# Patient Record
Sex: Female | Born: 1960 | Race: White | Hispanic: No | Smoking: Never smoker
Health system: Southern US, Community
[De-identification: ages and names within clinical notes are randomized; demographics above are authoritative.]

## PROBLEM LIST (undated history)

## (undated) DIAGNOSIS — N393 Stress incontinence (female) (male): Secondary | ICD-10-CM

## (undated) DIAGNOSIS — K5900 Constipation, unspecified: Secondary | ICD-10-CM

## (undated) DIAGNOSIS — IMO0002 Reserved for concepts with insufficient information to code with codable children: Secondary | ICD-10-CM

## (undated) DIAGNOSIS — N920 Excessive and frequent menstruation with regular cycle: Secondary | ICD-10-CM

## (undated) DIAGNOSIS — K219 Gastro-esophageal reflux disease without esophagitis: Secondary | ICD-10-CM

## (undated) DIAGNOSIS — M1990 Unspecified osteoarthritis, unspecified site: Secondary | ICD-10-CM

## (undated) DIAGNOSIS — J302 Other seasonal allergic rhinitis: Secondary | ICD-10-CM

## (undated) DIAGNOSIS — F419 Anxiety disorder, unspecified: Secondary | ICD-10-CM

## (undated) DIAGNOSIS — R638 Other symptoms and signs concerning food and fluid intake: Secondary | ICD-10-CM

## (undated) DIAGNOSIS — I10 Essential (primary) hypertension: Secondary | ICD-10-CM

## (undated) DIAGNOSIS — J45909 Unspecified asthma, uncomplicated: Secondary | ICD-10-CM

## (undated) DIAGNOSIS — Z780 Asymptomatic menopausal state: Secondary | ICD-10-CM

## (undated) HISTORY — DX: Unspecified osteoarthritis, unspecified site: M19.90

## (undated) HISTORY — DX: Asymptomatic menopausal state: Z78.0

## (undated) HISTORY — PX: ABLATION: SHX5711

## (undated) HISTORY — DX: Constipation, unspecified: K59.00

## (undated) HISTORY — DX: Other seasonal allergic rhinitis: J30.2

## (undated) HISTORY — DX: Excessive and frequent menstruation with regular cycle: N92.0

## (undated) HISTORY — PX: DILATION AND CURETTAGE OF UTERUS: SHX78

## (undated) HISTORY — PX: CARPAL TUNNEL RELEASE: SHX101

## (undated) HISTORY — DX: Reserved for concepts with insufficient information to code with codable children: IMO0002

## (undated) HISTORY — DX: Unspecified asthma, uncomplicated: J45.909

## (undated) HISTORY — DX: Anxiety disorder, unspecified: F41.9

## (undated) HISTORY — DX: Other symptoms and signs concerning food and fluid intake: R63.8

## (undated) HISTORY — DX: Essential (primary) hypertension: I10

## (undated) HISTORY — PX: HYSTEROSCOPY: SHX211

## (undated) HISTORY — DX: Stress incontinence (female) (male): N39.3

## (undated) HISTORY — PX: LAPAROSCOPIC GASTRIC SLEEVE RESECTION: SHX5895

## (undated) HISTORY — DX: Gastro-esophageal reflux disease without esophagitis: K21.9

---

## 2006-02-01 ENCOUNTER — Other Ambulatory Visit: Payer: Self-pay

## 2006-02-07 ENCOUNTER — Ambulatory Visit: Payer: Self-pay | Admitting: Podiatry

## 2006-11-22 ENCOUNTER — Emergency Department: Payer: Self-pay | Admitting: Unknown Physician Specialty

## 2010-04-07 ENCOUNTER — Encounter: Payer: Self-pay | Admitting: Unknown Physician Specialty

## 2010-04-20 ENCOUNTER — Encounter: Payer: Self-pay | Admitting: Unknown Physician Specialty

## 2010-05-19 ENCOUNTER — Encounter: Payer: Self-pay | Admitting: Unknown Physician Specialty

## 2011-02-28 ENCOUNTER — Ambulatory Visit: Payer: Self-pay | Admitting: Obstetrics and Gynecology

## 2011-03-06 ENCOUNTER — Ambulatory Visit: Payer: Self-pay | Admitting: Obstetrics and Gynecology

## 2011-06-15 ENCOUNTER — Ambulatory Visit: Payer: Self-pay | Admitting: Internal Medicine

## 2011-09-06 ENCOUNTER — Ambulatory Visit: Payer: Self-pay | Admitting: Anesthesiology

## 2011-09-06 LAB — ELECTROLYTE PANEL
Anion Gap: 6 — ABNORMAL LOW (ref 7–16)
Co2: 33 mmol/L — ABNORMAL HIGH (ref 21–32)
Potassium: 3.6 mmol/L (ref 3.5–5.1)
Sodium: 137 mmol/L (ref 136–145)

## 2011-09-08 ENCOUNTER — Ambulatory Visit: Payer: Self-pay | Admitting: Unknown Physician Specialty

## 2013-09-05 ENCOUNTER — Ambulatory Visit: Payer: Self-pay | Admitting: Obstetrics and Gynecology

## 2014-07-12 NOTE — Op Note (Signed)
PATIENT NAME:  Caitlyn [name], Caitlyn [name] MR#:  [id number] DATE OF BIRTH:  [birth year]  DATE OF PROCEDURE:  03/06/2011  PREOPERATIVE DIAGNOSIS: Menorrhagia.   POSTOPERATIVE DIAGNOSIS: Menorrhagia.  PROCEDURES PERFORMED: Dilation and curettage with hysteroscopy and NovaSure endometrial ablation.   SURGEON: Prentice DockerMartin [name]. Alfretta Pinch, MD   FIRST ASSISTANT: None.   ANESTHESIA: General LMA.   INDICATIONS: The patient is [name] with persistent chronic menorrhagia who desires definitive surgery. Preoperative endometrial biopsy was benign. Pelvic ultrasound revealed no significant anatomic abnormalities of the uterus.   FINDINGS AT SURGERY: The patient had [name]. The uterus sounded to 9 cm. The cervical length was 4.5 cm. The cavity width was 3.5 cm. There was [name].   DESCRIPTION OF PROCEDURE: The patient was brought to the Operating Room where she was placed in the supine position. General anesthesia was induced without difficulty using the LMA technique. She was placed in the dorsal lithotomy position using candy-cane stirrups. [name] in the standard fashion. [name]. [name] and [name]. The uterus was sounded to 9 cm. The endocervical canal was dilated to #8 [place] using Hanks dilators. The cervical length was measured at 4.5 cm. Curettage was performed of the endometrial cavity. Hysteroscopy was then performed and revealed no significant tissue within the endometrial cavity. The anatomic characteristics of the cavity were normal. The NovaSure ablation instrument was then inserted in the standard fashion and following the cavity test ablation was performed. [name]. Following completion of the procedure, the instrument was removed from the uterine cavity. Repeat hysteroscopy revealed an excellent char affect of the endometrial cavity. The procedure was then completed with all instrumentation being removed from the vagina. The patient was then awakened, mobilized, and taken to the recovery room in satisfactory condition. Estimated blood loss was minimal. There were no complications. All instrument, needle, and sponge counts were verified as correct. ____________________________ Prentice DockerMartin [name]. Mertice Uffelman, MD mad:slb D: 03/07/2011 08:33:30 ET T: 03/07/2011 09:20:37 ET JOB#: [id number]  cc: Daphine DeutscherMartin [name]. Breane Grunwald, MD, <Dictator> Prentice DockerMARTIN [name] Melyssa Signor MD ELECTRONICALLY SIGNED 03/23/2011 20:21

## 2014-07-27 DIAGNOSIS — I340 Nonrheumatic mitral (valve) insufficiency: Secondary | ICD-10-CM | POA: Insufficient documentation

## 2014-07-27 DIAGNOSIS — E7800 Pure hypercholesterolemia, unspecified: Secondary | ICD-10-CM | POA: Insufficient documentation

## 2014-07-27 DIAGNOSIS — I10 Essential (primary) hypertension: Secondary | ICD-10-CM | POA: Insufficient documentation

## 2014-07-27 DIAGNOSIS — E118 Type 2 diabetes mellitus with unspecified complications: Secondary | ICD-10-CM | POA: Insufficient documentation

## 2014-07-29 ENCOUNTER — Ambulatory Visit: Payer: BC Managed Care – PPO | Attending: Specialist

## 2014-07-29 DIAGNOSIS — E780 Pure hypercholesterolemia: Secondary | ICD-10-CM | POA: Diagnosis present

## 2014-07-29 DIAGNOSIS — G4733 Obstructive sleep apnea (adult) (pediatric): Secondary | ICD-10-CM | POA: Diagnosis not present

## 2015-05-13 ENCOUNTER — Other Ambulatory Visit: Payer: Self-pay

## 2015-05-13 MED ORDER — CONJ ESTROG-MEDROXYPROGEST ACE 0.625-2.5 MG PO TABS: 2.0000 | ORAL_TABLET | Freq: Every day | ORAL | Status: DC

## 2015-10-08 ENCOUNTER — Telehealth: Payer: Self-pay | Admitting: Obstetrics and Gynecology

## 2015-10-08 NOTE — Telephone Encounter (Signed)
PT WILL RUN OUT OF PRIMPRO BEFORE HER NEXT APPT. CAN SHE GET A REFILL

## 2015-10-11 MED ORDER — CONJ ESTROG-MEDROXYPROGEST ACE 0.625-2.5 MG PO TABS: 1.0000 | ORAL_TABLET | Freq: Every day | ORAL | 0 refills | Status: DC

## 2015-10-11 NOTE — Telephone Encounter (Signed)
Pt aware med erx. 

## 2015-10-27 ENCOUNTER — Encounter: Payer: Self-pay | Admitting: Obstetrics and Gynecology

## 2015-11-01 NOTE — Progress Notes (Signed)
ANNUAL PREVENTATIVE CARE GYN  ENCOUNTER NOTE  Subjective:       Caitlyn [name] is a 55 y.o. G1P0 female here for a routine annual gynecologic exam.  Current complaints: 1.  none   Patient is status post gastric sleeve operation with subsequent 76 pound weight loss. Vasomotor symptoms are less dramatic at this time and she has decreased her Prempro dosage to once a day. She is exercising and taking calcium with vitamin D supplementation. Bowel and bladder function are normal. Patient does report vaginal dryness and some discomfort with intercourse. She does use lubricants.   Gynecologic History No LMP recorded. Contraception: post menopausal status Last Pap: 10/2012 n/n. Results were: normal Last mammogram: 2015. Results were: normal  Obstetric History OB History  Gravida Para Term Preterm AB Living  1            SAB TAB Ectopic Multiple Live Births               # Outcome Date GA Lbr Len/2nd Weight Sex Delivery Anes PTL Lv  1 Gravida               Past Medical History:  Diagnosis Date  . Anxiety   . Arthritis   . Asthma   . Constipation   . Cystocele   . GERD (gastroesophageal reflux disease)   . Hypertension   . Increased BMI   . Menopause   . Menorrhagia   . Seasonal allergies   . Stress incontinence     Past Surgical History:  Procedure Laterality Date  . ABLATION    . CARPAL TUNNEL RELEASE    . CESAREAN SECTION     x 2  . DILATION AND CURETTAGE OF UTERUS    . HYSTEROSCOPY      Current Outpatient Prescriptions on File Prior to Visit  Medication Sig Dispense Refill  . estrogen, conjugated,-medroxyprogesterone (PREMPRO) 0.625-2.5 MG tablet Take 2 tablets by mouth daily. 60 tablet 1  . estrogen, conjugated,-medroxyprogesterone (PREMPRO) 0.625-2.5 MG tablet Take 1 tablet by mouth daily. 30 tablet 0   No current facility-administered medications on file prior to visit.     Allergies no known allergies  Social History   Social History  . Marital  status: Married    Spouse name: N/A  . Number of children: N/A  . Years of education: N/A   Occupational History  . Not on file.   Social History Main Topics  . Smoking status: Never Smoker  . Smokeless tobacco: Not on file  . Alcohol use Yes     Comment: 1-3 q week  . Drug use: No  . Sexual activity: Yes   Other Topics Concern  . Not on file   Social History Narrative  . No narrative on file    Family History  Problem Relation Age of Onset  . Breast cancer Maternal Aunt   . Colon cancer Paternal Grandfather   . Diabetes Paternal Grandfather   . Ovarian cancer Neg Hx   . Heart disease Neg Hx     The following portions of the patient's history were reviewed and updated as appropriate: allergies, current medications, past family history, past medical history, past social history, past surgical history and problem list.  Review of Systems ROS Review of Systems - General ROS: negative for - chills, fatigue, fever, hot flashes, night sweats, weight gain or weight loss Psychological ROS: negative for - anxiety, decreased libido, depression, mood swings, physical abuse or sexual abuse Ophthalmic ROS:  negative for - blurry vision, eye pain or loss of vision ENT ROS: negative for - headaches, hearing change, visual changes or vocal changes Allergy and Immunology ROS: negative for - hives, itchy/watery eyes or seasonal allergies Hematological and Lymphatic ROS: negative for - bleeding problems, bruising, swollen lymph nodes or weight loss Endocrine ROS: negative for - galactorrhea, hair pattern changes, hot flashes, malaise/lethargy, mood swings, palpitations, polydipsia/polyuria, skin changes, temperature intolerance or unexpected weight changes Breast ROS: negative for - new or changing breast lumps or nipple discharge Respiratory ROS: negative for - cough or shortness of breath Cardiovascular ROS: negative for - chest pain, irregular heartbeat, palpitations or shortness of  breath Gastrointestinal ROS: no abdominal pain, change in bowel habits, or black or bloody stools Genito-Urinary ROS: no dysuria, trouble voiding, or hematuria Musculoskeletal ROS: negative for - joint pain or joint stiffness Neurological ROS: negative for - bowel and bladder control changes Dermatological ROS: negative for rash and skin lesion changes   Objective:   BP (!) 156/79   Pulse (!) 56   Ht 5\' 1"  (1.549 m)   Wt 113 lb 3.2 oz (51.3 kg)   BMI 21.39 kg/m  CONSTITUTIONAL: Well-developed, well-nourished female in no acute distress.  PSYCHIATRIC: Normal mood and affect. Normal behavior. Normal judgment and thought content. NEUROLGIC: Alert and oriented to person, place, and time. Normal muscle tone coordination. No cranial nerve deficit noted. HENT:  Normocephalic, atraumatic, External right and left ear normal. Oropharynx is clear and moist EYES: Conjunctivae and EOM are normal. Pupils are equal, round, and reactive to light. No scleral icterus.  NECK: Normal range of motion, supple, no masses.  Normal thyroid.  SKIN: Skin is warm and dry. No rash noted. Not diaphoretic. No erythema. No pallor. CARDIOVASCULAR: Normal heart rate noted, regular rhythm, no murmur. RESPIRATORY: Clear to auscultation bilaterally. Effort and breath sounds normal, no problems with respiration noted. BREASTS: Symmetric in size. No masses, skin changes, nipple drainage, or lymphadenopathy. ABDOMEN: Soft, normal bowel sounds, no distention noted.  No tenderness, rebound or guarding.  BLADDER: Normal PELVIC:  External Genitalia: Normal  BUS: Normal  Vagina: Moderate atrophy  Cervix: Normal without lesions  Uterus: Normal; midplane, normal size and shape, mobile, nontender  Adnexa: Normal  RV: External hemorrhoids, nonthrombosed, No Rectal Masses and Normal Sphincter tone  MUSCULOSKELETAL: Normal range of motion. No tenderness.  No cyanosis, clubbing, or edema.  2+ distal pulses. LYMPHATIC: No Axillary,  Supraclavicular, or Inguinal Adenopathy.    Assessment:   Annual gynecologic examination 55 y.o. Contraception: Post menopausal bmi- 21 Problem List Items Addressed This Visit    None    Visit Diagnoses    Well woman exam with routine gynecological exam    -  Primary   Screen for colon cancer       Menopausal symptom       Increased BMI         Vaginal atrophy Plan:  Pap: Pap Co Test Mammogram: Ordered Stool Guaiac Testing:  Ordered Labs: thru pcp Routine preventative health maintenance measures emphasized: Exercise/Diet/Weight control, Tobacco Warnings and Alcohol/Substance use risks Continue Prempro 0.625/2.5 mg daily Trial of Premarin cream 1/2-1 g intravaginal twice a week Return to Clinic - 1 Year   SunGardCrystal Miller, CMA  Herold HarmsMartin A Kwadwo Taras, MD  Note: This dictation was prepared with Dragon dictation along with smaller phrase technology. Any transcriptional errors that result from this process are unintentional.

## 2015-11-03 ENCOUNTER — Encounter: Payer: Self-pay | Admitting: Obstetrics and Gynecology

## 2015-11-03 ENCOUNTER — Ambulatory Visit (INDEPENDENT_AMBULATORY_CARE_PROVIDER_SITE_OTHER): Payer: BC Managed Care – PPO | Admitting: Obstetrics and Gynecology

## 2015-11-03 VITALS — BP 156/79 | HR 56 | Ht 61.0 in | Wt 113.2 lb

## 2015-11-03 DIAGNOSIS — Z1211 Encounter for screening for malignant neoplasm of colon: Secondary | ICD-10-CM

## 2015-11-03 DIAGNOSIS — Z1239 Encounter for other screening for malignant neoplasm of breast: Secondary | ICD-10-CM

## 2015-11-03 DIAGNOSIS — N951 Menopausal and female climacteric states: Secondary | ICD-10-CM | POA: Diagnosis not present

## 2015-11-03 DIAGNOSIS — Z9889 Other specified postprocedural states: Secondary | ICD-10-CM

## 2015-11-03 DIAGNOSIS — Z01419 Encounter for gynecological examination (general) (routine) without abnormal findings: Secondary | ICD-10-CM

## 2015-11-03 DIAGNOSIS — N952 Postmenopausal atrophic vaginitis: Secondary | ICD-10-CM | POA: Diagnosis not present

## 2015-11-03 DIAGNOSIS — Z9884 Bariatric surgery status: Secondary | ICD-10-CM | POA: Insufficient documentation

## 2015-11-03 MED ORDER — CONJ ESTROG-MEDROXYPROGEST ACE 0.625-2.5 MG PO TABS: 1.0000 | ORAL_TABLET | Freq: Every day | ORAL | 11 refills | Status: DC

## 2015-11-03 MED ORDER — CONJ ESTROG-MEDROXYPROGEST ACE 0.625-2.5 MG PO TABS: 2.0000 | ORAL_TABLET | Freq: Every day | ORAL | 11 refills | Status: DC

## 2015-11-03 NOTE — Patient Instructions (Signed)
1. Pap smear is completed 2. Mammogram is ordered 3. Stool guaiac cards for colon cancer screening given 4. Continue with healthy eating and exercise 5. Continue with calcium and vitamin D supplementation daily 6. Return in 1 year for annual exam

## 2015-11-05 LAB — PAP IG AND HPV HIGH-RISK
HPV, HIGH-RISK: NEGATIVE
PAP Smear Comment: 0

## 2015-12-17 ENCOUNTER — Other Ambulatory Visit: Payer: Self-pay | Admitting: Internal Medicine

## 2015-12-17 DIAGNOSIS — R945 Abnormal results of liver function studies: Secondary | ICD-10-CM

## 2015-12-21 ENCOUNTER — Ambulatory Visit: Admission: RE | Admit: 2015-12-21 | Payer: BC Managed Care – PPO | Source: Ambulatory Visit

## 2016-11-07 ENCOUNTER — Encounter: Payer: BC Managed Care – PPO | Admitting: Obstetrics and Gynecology

## 2016-12-01 NOTE — Progress Notes (Deleted)
ANNUAL PREVENTATIVE CARE GYN  ENCOUNTER NOTE  Subjective:       Caitlyn [name] is a 56 y.o. G1P0 female here for a routine annual gynecologic exam.  Current complaints: 1.  none    Vasomotor symptoms are less dramatic at this time and she has decreased her Prempro dosage to once a day. She is exercising and taking calcium with vitamin D supplementation. Bowel and bladder function are normal. Patient does report vaginal dryness and some discomfort with intercourse. She does use lubricants.   Gynecologic History No LMP recorded. Contraception: post menopausal status Last Pap: 10/2015 n/n. Results were: normal Last mammogram: 2015 birad 1. Results were: normal  Obstetric History OB History  Gravida Para Term Preterm AB Living  SAB TAB Ectopic Multiple Live Births  3       2    # Outcome Date GA Lbr Len/2nd Weight Sex Delivery Anes PTL Lv  5 Term 1990   7 lb 1.8 oz (3.225 kg) F CS-LTranv   LIV  4 Term 1984   7 lb 1.8 oz (3.225 kg) M CS-LTranv   LIV  3 SAB           2 SAB           1 SAB               Past Medical History:  Diagnosis Date  . Anxiety   . Arthritis   . Asthma   . Constipation   . Cystocele   . GERD (gastroesophageal reflux disease)   . Hypertension   . Increased BMI   . Menopause   . Menorrhagia   . Seasonal allergies   . Stress incontinence     Past Surgical History:  Procedure Laterality Date  . ABLATION    . CARPAL TUNNEL RELEASE    . CESAREAN SECTION     x 2  . DILATION AND CURETTAGE OF UTERUS    . HYSTEROSCOPY    . LAPAROSCOPIC GASTRIC SLEEVE RESECTION      Current Outpatient Prescriptions on File Prior to Visit  Medication Sig Dispense Refill  . albuterol (PROVENTIL HFA;VENTOLIN HFA) 108 (90 Base) MCG/ACT inhaler Inhale into the lungs every 6 (six) hours as needed for wheezing or shortness of breath.    . budesonide-formoterol (SYMBICORT) 160-4.5 MCG/ACT inhaler Inhale 2 puffs into the lungs 2 (two) times daily.    .  calcium-vitamin D (OSCAL WITH D) 250-125 MG-UNIT tablet Take 1 tablet by mouth daily.    . celecoxib (CELEBREX) 100 MG capsule Take 100 mg by mouth 2 (two) times daily.    . cetirizine (ZYRTEC) 10 MG tablet Take 10 mg by mouth daily.    Marland Kitchen estrogen, conjugated,-medroxyprogesterone (PREMPRO) 0.625-2.5 MG tablet Take 1 tablet by mouth daily. 30 tablet 11  . fluticasone (FLONASE) 50 MCG/ACT nasal spray Place into both nostrils daily.    Marland Kitchen labetalol (NORMODYNE) 100 MG tablet Take 100 mg by mouth 2 (two) times daily.    . montelukast (SINGULAIR) 10 MG tablet Take 10 mg by mouth at bedtime.    . valsartan (DIOVAN) 320 MG tablet Take 320 mg by mouth daily.     No current facility-administered medications on file prior to visit.     No Known Allergies  Social History   Social History  . Marital status: Married    Spouse name: N/A  . Number of children: N/A  . Years of education: N/A  Occupational History  . Not on file.   Social History Main Topics  . Smoking status: Never Smoker  . Smokeless tobacco: Not on file  . Alcohol use Yes     Comment: 2 month  . Drug use: No  . Sexual activity: Yes    Birth control/ protection: Post-menopausal   Other Topics Concern  . Not on file   Social History Narrative  . No narrative on file    Family History  Problem Relation Age of Onset  . Breast cancer Maternal Aunt   . Colon cancer Paternal Grandfather   . Diabetes Paternal Grandfather   . Ovarian cancer Neg Hx   . Heart disease Neg Hx     The following portions of the patient's history were reviewed and updated as appropriate: allergies, current medications, past family history, past medical history, past social history, past surgical history and problem list.  Review of Systems ROS   Objective:   There were no vitals taken for this visit. CONSTITUTIONAL: Well-developed, well-nourished female in no acute distress.  PSYCHIATRIC: Normal mood and affect. Normal behavior. Normal  judgment and thought content. NEUROLGIC: Alert and oriented to person, place, and time. Normal muscle tone coordination. No cranial nerve deficit noted. HENT:  Normocephalic, atraumatic, External right and left ear normal. Oropharynx is clear and moist EYES: Conjunctivae and EOM are normal. Pupils are equal, round, and reactive to light. No scleral icterus.  NECK: Normal range of motion, supple, no masses.  Normal thyroid.  SKIN: Skin is warm and dry. No rash noted. Not diaphoretic. No erythema. No pallor. CARDIOVASCULAR: Normal heart rate noted, regular rhythm, no murmur. RESPIRATORY: Clear to auscultation bilaterally. Effort and breath sounds normal, no problems with respiration noted. BREASTS: Symmetric in size. No masses, skin changes, nipple drainage, or lymphadenopathy. ABDOMEN: Soft, normal bowel sounds, no distention noted.  No tenderness, rebound or guarding.  BLADDER: Normal PELVIC:  External Genitalia: Normal  BUS: Normal  Vagina: Moderate atrophy  Cervix: Normal without lesions  Uterus: Normal; midplane, normal size and shape, mobile, nontender  Adnexa: Normal  RV: External hemorrhoids, nonthrombosed, No Rectal Masses and Normal Sphincter tone  MUSCULOSKELETAL: Normal range of motion. No tenderness.  No cyanosis, clubbing, or edema.  2+ distal pulses. LYMPHATIC: No Axillary, Supraclavicular, or Inguinal Adenopathy.    Assessment:   Annual gynecologic examination 56 y.o. Contraception: Post menopausal bmi- 21 Problem List Items Addressed This Visit    Hx of gastric bypass   Vaginal atrophy   Menopausal state    Other Visit Diagnoses    Well woman exam with routine gynecological exam    -  Primary   Screen for colon cancer       Menopausal symptom       Screening for breast cancer         Vaginal atrophy Plan:  Pap: due 2020 Mammogram: Ordered Stool Guaiac Testing:  Ordered Labs: thru pcp Routine preventative health maintenance measures emphasized:  Exercise/Diet/Weight control, Tobacco Warnings and Alcohol/Substance use risks Continue Prempro 0.625/2.5 mg daily Trial of Premarin cream 1/2-1 g intravaginal twice a week Return to Clinic - 1 Year   [employer], CMA   Note: This dictation was prepared with Dragon dictation along with smaller phrase technology. Any transcriptional errors that result from this process are unintentional.

## 2016-12-06 ENCOUNTER — Encounter: Payer: BC Managed Care – PPO | Admitting: Obstetrics and Gynecology

## 2016-12-22 NOTE — Progress Notes (Signed)
ANNUAL PREVENTATIVE CARE GYN  ENCOUNTER NOTE  Subjective:       Caitlyn [name] is a 56 y.o. G5 [id number]  here for a routine annual gynecologic exam.  Current complaints: 1.  None  Vasomotor symptoms are less dramatic at this time and she has decreased her Prempro dosage to once a day. She is exercising and taking calcium with vitamin D supplementation. Denies vaginal bleeding, discharge, dryness, itchiness. Bowel and bladder function are normal.   Reports recent urinary frequency x 1 day with small voiding amounts. Denies dysuria, hematuria, urgency. Denies fever, chills, low back pain, abdominal pain, n/v.  Recent life stressors: recently started on Buspar due to anxiety. Separated from husband over past year.   Gynecologic History No LMP recorded. Contraception: post menopausal status Last Pap: 10/2015 n/n. Results were: normal Last mammogram: 2015 birad 1. Results were: normal  Obstetric History OB History  Gravida Para Term Preterm AB Living  SAB TAB Ectopic Multiple Live Births  3       2    # Outcome Date GA Lbr Len/2nd Weight Sex Delivery Anes PTL Lv  5 Term 1990   7 lb 1.8 oz (3.225 kg) F CS-LTranv   LIV  4 Term 1984   7 lb 1.8 oz (3.225 kg) M CS-LTranv   LIV  3 SAB           2 SAB           1 SAB               Past Medical History:  Diagnosis Date  . Anxiety   . Arthritis   . Asthma   . Constipation   . Cystocele   . GERD (gastroesophageal reflux disease)   . Hypertension   . Increased BMI   . Menopause   . Menorrhagia   . Seasonal allergies   . Stress incontinence     Past Surgical History:  Procedure Laterality Date  . ABLATION    . CARPAL TUNNEL RELEASE    . CESAREAN SECTION     x 2  . DILATION AND CURETTAGE OF UTERUS    . HYSTEROSCOPY    . LAPAROSCOPIC GASTRIC SLEEVE RESECTION      Current Outpatient Prescriptions on File Prior to Visit  Medication Sig Dispense Refill  . albuterol (PROVENTIL HFA;VENTOLIN HFA) 108 (90 Base)  MCG/ACT inhaler Inhale into the lungs every 6 (six) hours as needed for wheezing or shortness of breath.    . budesonide-formoterol (SYMBICORT) 160-4.5 MCG/ACT inhaler Inhale 2 puffs into the lungs 2 (two) times daily.    . calcium-vitamin D (OSCAL WITH D) 250-125 MG-UNIT tablet Take 1 tablet by mouth daily.    . celecoxib (CELEBREX) 100 MG capsule Take 100 mg by mouth 2 (two) times daily.    . cetirizine (ZYRTEC) 10 MG tablet Take 10 mg by mouth daily.    Marland Kitchen estrogen, conjugated,-medroxyprogesterone (PREMPRO) 0.625-2.5 MG tablet Take 1 tablet by mouth daily. 30 tablet 11  . fluticasone (FLONASE) 50 MCG/ACT nasal spray Place into both nostrils daily.    Marland Kitchen labetalol (NORMODYNE) 100 MG tablet Take 100 mg by mouth 2 (two) times daily.    . montelukast (SINGULAIR) 10 MG tablet Take 10 mg by mouth at bedtime.    . valsartan (DIOVAN) 320 MG tablet Take 320 mg by mouth daily.     No current facility-administered medications on file prior to visit.  No Known Allergies  Social History   Social History  . Marital status: Married    Spouse name: N/A  . Number of children: N/A  . Years of education: N/A   Occupational History  . Not on file.   Social History Main Topics  . Smoking status: Never Smoker  . Smokeless tobacco: Not on file  . Alcohol use Yes     Comment: 2 month  . Drug use: No  . Sexual activity: Yes    Birth control/ protection: Post-menopausal   Other Topics Concern  . Not on file   Social History Narrative  . No narrative on file    Family History  Problem Relation Age of Onset  . Breast cancer Maternal Aunt   . Colon cancer Paternal Grandfather   . Diabetes Paternal Grandfather   . Ovarian cancer Neg Hx   . Heart disease Neg Hx     The following portions of the patient's history were reviewed and updated as appropriate: allergies, current medications, past family history, past medical history, past social history, past surgical history and problem  list.  Review of Systems Review of Systems  Constitutional: Negative for chills, fever and malaise/fatigue.  HENT: Negative for hearing loss, sore throat and tinnitus.   Eyes: Negative for blurred vision and pain.  Respiratory: Negative for cough, shortness of breath and wheezing.   Cardiovascular: Negative for chest pain, palpitations, claudication and leg swelling.  Gastrointestinal: Negative for abdominal pain, constipation, diarrhea, nausea and vomiting.  Genitourinary:       See HPI  Musculoskeletal: Negative for myalgias.  Skin: Negative for rash.  Neurological: Negative for dizziness, loss of consciousness, weakness and headaches.     Objective:   BP (!) 145/74   Pulse (!) 53   Ht  (1.549 m)   Wt 128 lb 11.2 oz (58.4 kg)   BMI 24.32 kg/m  CONSTITUTIONAL: Well-developed, well-nourished female in no acute distress.  PSYCHIATRIC: Normal mood and affect. Normal behavior. Normal judgment and thought content. NEUROLGIC: Alert and oriented to person, place, and time. Normal muscle tone coordination. No cranial nerve deficit noted. HENT:  Normocephalic, atraumatic, External right and left ear normal. Oropharynx is clear and moist EYES: Conjunctivae and EOM are normal. Pupils are equal, round, and reactive to light. No scleral icterus.  NECK: Normal range of motion, supple, no masses.  Normal thyroid.  SKIN: Skin is warm and dry. No rash noted. Not diaphoretic. No erythema. No pallor. CARDIOVASCULAR: Normal heart rate noted, regular rhythm, no murmur. RESPIRATORY: Clear to auscultation bilaterally. Effort and breath sounds normal, no problems with respiration noted. BREASTS: Symmetric in size. No masses, skin changes, nipple drainage, or lymphadenopathy. ABDOMEN: Soft, normal bowel sounds, no distention noted.  No tenderness, rebound or guarding.  BLADDER: Normal PELVIC:  External Genitalia: Normal  BUS: Normal  Vagina: Moderate atrophy  Cervix: Normal without  lesions  Uterus: Normal; anteverted, normal size and shape, mobile, nontender  Adnexa: Normal  RV: External hemorrhoids, nonthrombosed, No Rectal Masses and Normal Sphincter tone  MUSCULOSKELETAL: Normal range of motion. No tenderness.  No cyanosis, clubbing, or edema.  2+ distal pulses. LYMPHATIC: No Axillary, Supraclavicular, or Inguinal Adenopathy.  Assessment:   Annual gynecologic examination 56 y.o. Contraception: Post menopausal bmi- 24 Problem List Items Addressed This Visit    Hx of gastric bypass   Vaginal atrophy   Menopausal state    Other Visit Diagnoses    Well woman exam with routine gynecological exam    -  Primary   Screen for colon cancer       Menopausal symptom       Screening for breast cancer        Urinary frequency Marital stressors  Plan:  Pap: due 2020 Mammogram: Ordered Stool Guaiac Testing:  Ordered Labs: thru pcp Routine preventative health maintenance measures emphasized: Exercise/Diet/Weight control, Tobacco Warnings and Alcohol/Substance use risks Continue Prempro 0.625/2.5 mg daily Continue calcium and vitamin D supplements Return to Clinic - 1 Year Urinalysis WNL - culture sent    Darol Destine, CMA  Arther Abbott, PA-S Herold Harms, MD   I have seen, interviewed, and examined the patient in conjunction with the [facility] student and affirm the diagnosis and management plan. Caitlyn Guderian A. Tayloranne Lekas, MD, FACOG   Note: This dictation was prepared with Dragon dictation along with smaller phrase technology. Any transcriptional errors that result from this process are unintentional.

## 2016-12-27 ENCOUNTER — Telehealth: Payer: Self-pay | Admitting: Obstetrics and Gynecology

## 2016-12-27 ENCOUNTER — Ambulatory Visit (INDEPENDENT_AMBULATORY_CARE_PROVIDER_SITE_OTHER): Payer: BC Managed Care – PPO | Admitting: Obstetrics and Gynecology

## 2016-12-27 ENCOUNTER — Encounter: Payer: Self-pay | Admitting: Obstetrics and Gynecology

## 2016-12-27 VITALS — BP 145/74 | HR 53 | Ht 61.0 in | Wt 128.7 lb

## 2016-12-27 DIAGNOSIS — Z01419 Encounter for gynecological examination (general) (routine) without abnormal findings: Secondary | ICD-10-CM | POA: Diagnosis not present

## 2016-12-27 DIAGNOSIS — Z1211 Encounter for screening for malignant neoplasm of colon: Secondary | ICD-10-CM

## 2016-12-27 DIAGNOSIS — Z1231 Encounter for screening mammogram for malignant neoplasm of breast: Secondary | ICD-10-CM

## 2016-12-27 DIAGNOSIS — Z1239 Encounter for other screening for malignant neoplasm of breast: Secondary | ICD-10-CM

## 2016-12-27 DIAGNOSIS — N952 Postmenopausal atrophic vaginitis: Secondary | ICD-10-CM

## 2016-12-27 DIAGNOSIS — Z9884 Bariatric surgery status: Secondary | ICD-10-CM

## 2016-12-27 DIAGNOSIS — Z630 Problems in relationship with spouse or partner: Secondary | ICD-10-CM

## 2016-12-27 DIAGNOSIS — N951 Menopausal and female climacteric states: Secondary | ICD-10-CM

## 2016-12-27 DIAGNOSIS — R300 Dysuria: Secondary | ICD-10-CM | POA: Diagnosis not present

## 2016-12-27 LAB — POCT URINALYSIS DIPSTICK
Bilirubin, UA: NEGATIVE
Blood, UA: NEGATIVE
Glucose, UA: NEGATIVE
Ketones, UA: NEGATIVE
LEUKOCYTES UA: NEGATIVE
NITRITE UA: NEGATIVE
PH UA: 6 (ref 5.0–8.0)
PROTEIN UA: NEGATIVE
Spec Grav, UA: 1.025 (ref 1.010–1.025)
UROBILINOGEN UA: 0.2 U/dL

## 2016-12-27 MED ORDER — CONJ ESTROG-MEDROXYPROGEST ACE 0.625-2.5 MG PO TABS: 1.0000 | ORAL_TABLET | Freq: Every day | ORAL | 11 refills | Status: DC

## 2016-12-27 NOTE — Telephone Encounter (Signed)
Pt aware ua neg. Waiting on culture.

## 2016-12-27 NOTE — Telephone Encounter (Signed)
Patient LVM to call her with the results of her urine

## 2016-12-27 NOTE — Patient Instructions (Signed)
1. No Pap smear is needed this year. 2. Mammogram is ordered 3. Stool guaiac cards are given for colon cancer screening 4. Screening labs are to be obtained for primary care 5. Continue with healthy eating and exercise 6. Return in 1 year for annual exam 7. Prempro 0.65/2.5 mg daily is prescribed.   Health Maintenance for Postmenopausal Women Menopause is a normal process in which your reproductive ability comes to an end. This process happens gradually over a span of months to years, usually between the ages of 50 and 52. Menopause is complete when you have missed 12 consecutive menstrual periods. It is important to talk with your health care provider about some of the most common conditions that affect postmenopausal women, such as heart disease, cancer, and bone loss (osteoporosis). Adopting a healthy lifestyle and getting preventive care can help to promote your health and wellness. Those actions can also lower your chances of developing some of these common conditions. What should I know about menopause? During menopause, you may experience a number of symptoms, such as:  Moderate-to-severe hot flashes.  Night sweats.  Decrease in sex drive.  Mood swings.  Headaches.  Tiredness.  Irritability.  Memory problems.  Insomnia.  Choosing to treat or not to treat menopausal changes is an individual decision that you make with your health care provider. What should I know about hormone replacement therapy and supplements? Hormone therapy products are effective for treating symptoms that are associated with menopause, such as hot flashes and night sweats. Hormone replacement carries certain risks, especially as you become older. If you are thinking about using estrogen or estrogen with progestin treatments, discuss the benefits and risks with your health care provider. What should I know about heart disease and stroke? Heart disease, heart attack, and stroke become more likely as you  age. This may be due, in part, to the hormonal changes that your body experiences during menopause. These can affect how your body processes dietary fats, triglycerides, and cholesterol. Heart attack and stroke are both medical emergencies. There are many things that you can do to help prevent heart disease and stroke:  Have your blood pressure checked at least every 1-2 years. High blood pressure causes heart disease and increases the risk of stroke.  If you are 109-75 years old, ask your health care provider if you should take aspirin to prevent a heart attack or a stroke.  Do not use any tobacco products, including cigarettes, chewing tobacco, or electronic cigarettes. If you need help quitting, ask your health care provider.  It is important to eat a healthy diet and maintain a healthy weight. ? Be sure to include plenty of vegetables, fruits, low-fat dairy products, and lean protein. ? Avoid eating foods that are high in solid fats, added sugars, or salt (sodium).  Get regular exercise. This is one of the most important things that you can do for your health. ? Try to exercise for at least 150 minutes each week. The type of exercise that you do should increase your heart rate and make you sweat. This is known as moderate-intensity exercise. ? Try to do strengthening exercises at least twice each week. Do these in addition to the moderate-intensity exercise.  Know your numbers.Ask your health care provider to check your cholesterol and your blood glucose. Continue to have your blood tested as directed by your health care provider.  What should I know about cancer screening? There are several types of cancer. Take the following steps to  reduce your risk and to catch any cancer development as early as possible. Breast Cancer  Practice breast self-awareness. ? This means understanding how your breasts normally appear and feel. ? It also means doing regular breast self-exams. Let your health  care provider know about any changes, no matter how small.  If you are 85 or older, have a clinician do a breast exam (clinical breast exam or CBE) every year. Depending on your age, family history, and medical history, it may be recommended that you also have a yearly breast X-ray (mammogram).  If you have a family history of breast cancer, talk with your health care provider about genetic screening.  If you are at high risk for breast cancer, talk with your health care provider about having an MRI and a mammogram every year.  Breast cancer (BRCA) gene test is recommended for women who have family members with BRCA-related cancers. Results of the assessment will determine the need for genetic counseling and BRCA1 and for BRCA2 testing. BRCA-related cancers include these types: ? Breast. This occurs in males or females. ? Ovarian. ? Tubal. This may also be called fallopian tube cancer. ? Cancer of the abdominal or pelvic lining (peritoneal cancer). ? Prostate. ? Pancreatic.  Cervical, Uterine, and Ovarian Cancer Your health care provider may recommend that you be screened regularly for cancer of the pelvic organs. These include your ovaries, uterus, and vagina. This screening involves a pelvic exam, which includes checking for microscopic changes to the surface of your cervix (Pap test).  For women ages 21-65, health care providers may recommend a pelvic exam and a Pap test every three years. For women ages 51-65, they may recommend the Pap test and pelvic exam, combined with testing for human papilloma virus (HPV), every five years. Some types of HPV increase your risk of cervical cancer. Testing for HPV may also be done on women of any age who have unclear Pap test results.  Other health care providers may not recommend any screening for nonpregnant women who are considered low risk for pelvic cancer and have no symptoms. Ask your health care provider if a screening pelvic exam is right for  you.  If you have had past treatment for cervical cancer or a condition that could lead to cancer, you need Pap tests and screening for cancer for at least 20 years after your treatment. If Pap tests have been discontinued for you, your risk factors (such as having a new sexual partner) need to be reassessed to determine if you should start having screenings again. Some women have medical problems that increase the chance of getting cervical cancer. In these cases, your health care provider may recommend that you have screening and Pap tests more often.  If you have a family history of uterine cancer or ovarian cancer, talk with your health care provider about genetic screening.  If you have vaginal bleeding after reaching menopause, tell your health care provider.  There are currently no reliable tests available to screen for ovarian cancer.  Lung Cancer Lung cancer screening is recommended for adults 33-50 years old who are at high risk for lung cancer because of a history of smoking. A yearly low-dose CT scan of the lungs is recommended if you:  Currently smoke.  Have a history of at least 30 pack-years of smoking and you currently smoke or have quit within the past 15 years. A pack-year is smoking an average of one pack of cigarettes per day for one year.  Yearly screening should:  Continue until it has been 15 years since you quit.  Stop if you develop a health problem that would prevent you from having lung cancer treatment.  Colorectal Cancer  This type of cancer can be detected and can often be prevented.  Routine colorectal cancer screening usually begins at age 24 and continues through age 20.  If you have risk factors for colon cancer, your health care provider may recommend that you be screened at an earlier age.  If you have a family history of colorectal cancer, talk with your health care provider about genetic screening.  Your health care provider may also recommend  using home test kits to check for hidden blood in your stool.  A small camera at the end of a tube can be used to examine your colon directly (sigmoidoscopy or colonoscopy). This is done to check for the earliest forms of colorectal cancer.  Direct examination of the colon should be repeated every 5-10 years until age 81. However, if early forms of precancerous polyps or small growths are found or if you have a family history or genetic risk for colorectal cancer, you may need to be screened more often.  Skin Cancer  Check your skin from head to toe regularly.  Monitor any moles. Be sure to tell your health care provider: ? About any new moles or changes in moles, especially if there is a change in a mole's shape or color. ? If you have a mole that is larger than the size of a pencil eraser.  If any of your family members has a history of skin cancer, especially at a young age, talk with your health care provider about genetic screening.  Always use sunscreen. Apply sunscreen liberally and repeatedly throughout the day.  Whenever you are outside, protect yourself by wearing long sleeves, pants, a wide-brimmed hat, and sunglasses.  What should I know about osteoporosis? Osteoporosis is a condition in which bone destruction happens more quickly than new bone creation. After menopause, you may be at an increased risk for osteoporosis. To help prevent osteoporosis or the bone fractures that can happen because of osteoporosis, the following is recommended:  If you are 51-18 years old, get at least 1,000 mg of calcium and at least 600 mg of vitamin D per day.  If you are older than age 7 but younger than age 39, get at least 1,200 mg of calcium and at least 600 mg of vitamin D per day.  If you are older than age 34, get at least 1,200 mg of calcium and at least 800 mg of vitamin D per day.  Smoking and excessive alcohol intake increase the risk of osteoporosis. Eat foods that are rich in  calcium and vitamin D, and do weight-bearing exercises several times each week as directed by your health care provider. What should I know about how menopause affects my mental health? Depression may occur at any age, but it is more common as you become older. Common symptoms of depression include:  Low or sad mood.  Changes in sleep patterns.  Changes in appetite or eating patterns.  Feeling an overall lack of motivation or enjoyment of activities that you previously enjoyed.  Frequent crying spells.  Talk with your health care provider if you think that you are experiencing depression. What should I know about immunizations? It is important that you get and maintain your immunizations. These include:  Tetanus, diphtheria, and pertussis (Tdap) booster vaccine.  Influenza  every year before the flu season begins.  Pneumonia vaccine.  Shingles vaccine.  Your health care provider may also recommend other immunizations. This information is not intended to replace advice given to you by your health care provider. Make sure you discuss any questions you have with your health care provider. Document Released: 04/28/2005 Document Revised: 09/24/2015 Document Reviewed: 12/08/2014 Elsevier Interactive Patient Education  2018 [employer]  

## 2016-12-28 DIAGNOSIS — N951 Menopausal and female climacteric states: Secondary | ICD-10-CM | POA: Insufficient documentation

## 2016-12-28 DIAGNOSIS — Z630 Problems in relationship with spouse or partner: Secondary | ICD-10-CM | POA: Insufficient documentation

## 2016-12-29 LAB — URINE CULTURE: Organism ID, Bacteria: NO GROWTH

## 2016-12-29 NOTE — Telephone Encounter (Signed)
Urine culture- neg. Pt aware.

## 2017-09-28 ENCOUNTER — Ambulatory Visit
Admission: RE | Admit: 2017-09-28 | Discharge: 2017-09-28 | Disposition: A | Discharge status: Home / Self Care | Payer: BC Managed Care – PPO | Source: Ambulatory Visit | Attending: Obstetrics and Gynecology | Admitting: Obstetrics and Gynecology

## 2017-09-28 DIAGNOSIS — Z1231 Encounter for screening mammogram for malignant neoplasm of breast: Secondary | ICD-10-CM | POA: Diagnosis present

## 2017-09-28 DIAGNOSIS — Z1239 Encounter for other screening for malignant neoplasm of breast: Secondary | ICD-10-CM

## 2017-12-27 NOTE — Progress Notes (Deleted)
ANNUAL PREVENTATIVE CARE GYN  ENCOUNTER NOTE  Subjective:       Caitlyn [name] is a 57 y.o. G5 [id number]  here for a routine annual gynecologic exam.  Current complaints: 1.  None   She is exercising and taking calcium with vitamin D supplementation. Denies vaginal bleeding, discharge, dryness, itchiness. Bowel and bladder function are normal.   R  Gynecologic History No LMP recorded. Patient is postmenopausal. Contraception: post menopausal status Last Pap: 10/2015 n/n. Results were: normal Last mammogram: 09/28/2017 birad 1. Results were: normal  Obstetric History OB History  Gravida Para Term Preterm AB Living  5 2 2   3 2   SAB TAB Ectopic Multiple Live Births  3       2    # Outcome Date GA Lbr Len/2nd Weight Sex Delivery Anes PTL Lv  5 Term 1990   7 lb 1.8 oz (3.225 kg) F CS-LTranv   LIV  4 Term 1984   7 lb 1.8 oz (3.225 kg) M CS-LTranv   LIV  3 SAB           2 SAB           1 SAB             Past Medical History:  Diagnosis Date  . Anxiety   . Arthritis   . Asthma   . Constipation   . Cystocele   . GERD (gastroesophageal reflux disease)   . Hypertension   . Increased BMI   . Menopause   . Menorrhagia   . Seasonal allergies   . Stress incontinence     Past Surgical History:  Procedure Laterality Date  . ABLATION    . CARPAL TUNNEL RELEASE    . CESAREAN SECTION     x 2  . DILATION AND CURETTAGE OF UTERUS    . HYSTEROSCOPY    . LAPAROSCOPIC GASTRIC SLEEVE RESECTION      Current Outpatient Medications on File Prior to Visit  Medication Sig Dispense Refill  . albuterol (PROVENTIL HFA;VENTOLIN HFA) 108 (90 Base) MCG/ACT inhaler Inhale into the lungs every 6 (six) hours as needed for wheezing or shortness of breath.    Marland Kitchen atomoxetine (STRATTERA) 80 MG capsule atomoxetine 80 mg capsule    . atorvastatin (LIPITOR) 10 MG tablet atorvastatin 10 mg tablet    . budesonide-formoterol (SYMBICORT) 160-4.5 MCG/ACT inhaler Inhale 2 puffs into the lungs 2 (two) times  daily.    . calcium-vitamin D (OSCAL WITH D) 250-125 MG-UNIT tablet Take 1 tablet by mouth daily.    . celecoxib (CELEBREX) 100 MG capsule Take 100 mg by mouth 2 (two) times daily.    . cetirizine (ZYRTEC) 10 MG tablet Take 10 mg by mouth daily.    Marland Kitchen estrogen, conjugated,-medroxyprogesterone (PREMPRO) 0.625-2.5 MG tablet Take 1 tablet by mouth daily. 30 tablet 11  . fluticasone (FLONASE) 50 MCG/ACT nasal spray Place into both nostrils daily.    Marland Kitchen labetalol (NORMODYNE) 100 MG tablet Take 100 mg by mouth 2 (two) times daily.    . montelukast (SINGULAIR) 10 MG tablet Take 10 mg by mouth at bedtime.     No current facility-administered medications on file prior to visit.     No Known Allergies  Social History   Socioeconomic History  . Marital status: Married    Spouse name: Not on file  . Number of children: Not on file  . Years of education: Not on file  . Highest education level: Not on  file  Occupational History  . Not on file  Social Needs  . Financial resource strain: Not on file  . Food insecurity:    Worry: Not on file    Inability: Not on file  . Transportation needs:    Medical: Not on file    Non-medical: Not on file  Tobacco Use  . Smoking status: Never Smoker  . Smokeless tobacco: Never Used  Substance and Sexual Activity  . Alcohol use: Yes    Comment: 2 month  . Drug use: No  . Sexual activity: Not Currently    Birth control/protection: Post-menopausal  Lifestyle  . Physical activity:    Days per week: Not on file    Minutes per session: Not on file  . Stress: Not on file  Relationships  . Social connections:    Talks on phone: Not on file    Gets together: Not on file    Attends religious service: Not on file    Active member of club or organization: Not on file    Attends meetings of clubs or organizations: Not on file    Relationship status: Not on file  . Intimate partner violence:    Fear of current or ex partner: Not on file    Emotionally  abused: Not on file    Physically abused: Not on file    Forced sexual activity: Not on file  Other Topics Concern  . Not on file  Social History Narrative  . Not on file    Family History  Problem Relation Age of Onset  . Breast cancer Maternal Aunt 35  . Colon cancer Paternal Grandfather   . Diabetes Paternal Grandfather   . Ovarian cancer Neg Hx   . Heart disease Neg Hx     The following portions of the patient's history were reviewed and updated as appropriate: allergies, current medications, past family history, past medical history, past social history, past surgical history and problem list.  Review of Systems   Objective:   There were no vitals taken for this visit. CONSTITUTIONAL: Well-developed, well-nourished female in no acute distress.  PSYCHIATRIC: Normal mood and affect. Normal behavior. Normal judgment and thought content. NEUROLGIC: Alert and oriented to person, place, and time. Normal muscle tone coordination. No cranial nerve deficit noted. HENT:  Normocephalic, atraumatic, External right and left ear normal. Oropharynx is clear and moist EYES: Conjunctivae and EOM are normal. Pupils are equal, round, and reactive to light. No scleral icterus.  NECK: Normal range of motion, supple, no masses.  Normal thyroid.  SKIN: Skin is warm and dry. No rash noted. Not diaphoretic. No erythema. No pallor. CARDIOVASCULAR: Normal heart rate noted, regular rhythm, no murmur. RESPIRATORY: Clear to auscultation bilaterally. Effort and breath sounds normal, no problems with respiration noted. BREASTS: Symmetric in size. No masses, skin changes, nipple drainage, or lymphadenopathy. ABDOMEN: Soft, normal bowel sounds, no distention noted.  No tenderness, rebound or guarding.  BLADDER: Normal PELVIC:  External Genitalia: Normal  BUS: Normal  Vagina: Moderate atrophy  Cervix: Normal without lesions  Uterus: Normal; anteverted, normal size and shape, mobile, nontender  Adnexa:  Normal  RV: External hemorrhoids, nonthrombosed, No Rectal Masses and Normal Sphincter tone  MUSCULOSKELETAL: Normal range of motion. No tenderness.  No cyanosis, clubbing, or edema.  2+ distal pulses. LYMPHATIC: No Axillary, Supraclavicular, or Inguinal Adenopathy.  Assessment:   Annual gynecologic examination 57 y.o. Contraception: Post menopausal bmi- 24 Problem List Items Addressed This Visit    Hx of  gastric bypass   Vaginal atrophy   Menopausal symptom    Other Visit Diagnoses    Well woman exam with routine gynecological exam    -  Primary   Screen for colon cancer       Screening for breast cancer           Plan:  Pap: due 2020 Mammogram: Ordered Stool Guaiac Testing:  Ordered ??? colonoscopy Labs: thru pcp Routine preventative health maintenance measures emphasized: Exercise/Diet/Weight control, Tobacco Warnings and Alcohol/Substance use risks Continue Prempro 0.625/2.5 mg daily Continue calcium and vitamin D supplements Return to Clinic - 1 Year    [employer], CMA    I have seen, interviewed, and examined the patient in conjunction with the [facility] student and affirm the diagnosis and management plan. Martin A. DeFrancesco, MD, FACOG   Note: This dictation was prepared with Dragon dictation along with smaller phrase technology. Any transcriptional errors that result from this process are unintentional.

## 2018-01-02 ENCOUNTER — Encounter: Payer: BC Managed Care – PPO | Admitting: Obstetrics and Gynecology

## 2018-01-04 ENCOUNTER — Other Ambulatory Visit: Payer: Self-pay | Admitting: Obstetrics and Gynecology

## 2018-01-31 ENCOUNTER — Encounter: Payer: BC Managed Care – PPO | Admitting: Obstetrics and Gynecology

## 2018-02-12 ENCOUNTER — Other Ambulatory Visit: Payer: Self-pay | Admitting: Obstetrics and Gynecology

## 2018-02-18 NOTE — Progress Notes (Signed)
ANNUAL PREVENTATIVE CARE GYN  ENCOUNTER NOTE  Subjective:       Caitlyn [name] is a 57 y.o. G5 [id number]  here for a routine annual gynecologic exam.  Current complaints: 1.  None  Minimal vasomotor symptoms are noted on daily Prempro.  She is not experiencing any vaginal bleeding, discharge.  She is exercising and taking calcium with vitamin D supplementation.  Bowel and bladder function are normal with the occasional stress incontinence noted with cough..    Gynecologic History No LMP recorded. Patient is postmenopausal. Contraception: post menopausal status Last Pap: 10/2015 n/n. Results were: normal Last mammogram: 09/2017 birad 1. Results were: normal Prempro 0.65/2.5 mg daily-HRT Status post endometrial ablation  Obstetric History OB History  Gravida Para Term Preterm AB Living  5 2 2   3 2   SAB TAB Ectopic Multiple Live Births  3       2    # Outcome Date GA Lbr Len/2nd Weight Sex Delivery Anes PTL Lv  5 Term 1990   7 lb 1.8 oz (3.225 kg) F CS-LTranv   LIV  4 Term 1984   7 lb 1.8 oz (3.225 kg) M CS-LTranv   LIV  3 SAB           2 SAB           1 SAB             Past Medical History:  Diagnosis Date  . Anxiety   . Arthritis   . Asthma   . Constipation   . Cystocele   . GERD (gastroesophageal reflux disease)   . Hypertension   . Increased BMI   . Menopause   . Menorrhagia   . Seasonal allergies   . Stress incontinence     Past Surgical History:  Procedure Laterality Date  . ABLATION    . CARPAL TUNNEL RELEASE    . CESAREAN SECTION     x 2  . DILATION AND CURETTAGE OF UTERUS    . HYSTEROSCOPY    . LAPAROSCOPIC GASTRIC SLEEVE RESECTION      Current Outpatient Medications on File Prior to Visit  Medication Sig Dispense Refill  . albuterol (PROVENTIL HFA;VENTOLIN HFA) 108 (90 Base) MCG/ACT inhaler Inhale into the lungs every 6 (six) hours as needed for wheezing or shortness of breath.    Marland Kitchen atomoxetine (STRATTERA) 80 MG capsule atomoxetine 80 mg capsule     . atorvastatin (LIPITOR) 10 MG tablet atorvastatin 10 mg tablet    . budesonide-formoterol (SYMBICORT) 160-4.5 MCG/ACT inhaler Inhale 2 puffs into the lungs 2 (two) times daily.    . calcium-vitamin D (OSCAL WITH D) 250-125 MG-UNIT tablet Take 1 tablet by mouth daily.    . celecoxib (CELEBREX) 100 MG capsule Take 100 mg by mouth 2 (two) times daily.    . cetirizine (ZYRTEC) 10 MG tablet Take 10 mg by mouth daily.    . fluticasone (FLONASE) 50 MCG/ACT nasal spray Place into both nostrils daily.    Marland Kitchen labetalol (NORMODYNE) 100 MG tablet Take 100 mg by mouth 2 (two) times daily.    . montelukast (SINGULAIR) 10 MG tablet Take 10 mg by mouth at bedtime.    Marland Kitchen PREMPRO 0.625-2.5 MG tablet TAKE 1 TABLET BY MOUTH ONCE DAILY 28 tablet 0   No current facility-administered medications on file prior to visit.     No Known Allergies  Social History   Socioeconomic History  . Marital status: Married    Spouse name:  Not on file  . Number of children: Not on file  . Years of education: Not on file  . Highest education level: Not on file  Occupational History  . Not on file  Social Needs  . Financial resource strain: Not on file  . Food insecurity:    Worry: Not on file    Inability: Not on file  . Transportation needs:    Medical: Not on file    Non-medical: Not on file  Tobacco Use  . Smoking status: Never Smoker  . Smokeless tobacco: Never Used  Substance and Sexual Activity  . Alcohol use: Yes    Comment: 2 month  . Drug use: No  . Sexual activity: Not Currently    Birth control/protection: Post-menopausal  Lifestyle  . Physical activity:    Days per week: Not on file    Minutes per session: Not on file  . Stress: Not on file  Relationships  . Social connections:    Talks on phone: Not on file    Gets together: Not on file    Attends religious service: Not on file    Active member of club or organization: Not on file    Attends meetings of clubs or organizations: Not on file     Relationship status: Not on file  . Intimate partner violence:    Fear of current or ex partner: Not on file    Emotionally abused: Not on file    Physically abused: Not on file    Forced sexual activity: Not on file  Other Topics Concern  . Not on file  Social History Narrative  . Not on file    Family History  Problem Relation Age of Onset  . Breast cancer Maternal Aunt 35  . Colon cancer Paternal Grandfather   . Diabetes Paternal Grandfather   . Ovarian cancer Neg Hx   . Heart disease Neg Hx     The following portions of the patient's history were reviewed and updated as appropriate: allergies, current medications, past family history, past medical history, past social history, past surgical history and problem list.  Review of Systems  Review of Systems  Constitutional: Negative.   Cardiovascular: Negative.   Gastrointestinal: Negative.   Genitourinary:       Occasional stress incontinence with coughing  Musculoskeletal: Negative.   Skin: Negative.   Neurological: Negative.   Psychiatric/Behavioral: Negative.     Objective:   BP (!) 150/80   Pulse (!) 55   Ht 5\' 1"  (1.549 m)   Wt 133 lb 3.2 oz (60.4 kg)   BMI 25.17 kg/m  CONSTITUTIONAL: Well-developed, well-nourished female in no acute distress.  PSYCHIATRIC: Normal mood and affect. Normal behavior. Normal judgment and thought content. NEUROLGIC: Alert and oriented to person, place, and time. Normal muscle tone coordination. No cranial nerve deficit noted. HENT:  Normocephalic, atraumatic, External right and left ear normal.  EYES: Conjunctivae and EOM are normal. Pupils are equal, round, and reactive to light. No scleral icterus.  NECK: Normal range of motion, supple, no masses.  Normal thyroid.  SKIN: Skin is warm and dry. No rash noted. Not diaphoretic. No erythema. No pallor. CARDIOVASCULAR: Normal heart rate noted, regular rhythm, no murmur. RESPIRATORY: Clear to auscultation bilaterally. Effort and  breath sounds normal, no problems with respiration noted. BREASTS: Symmetric in size. No masses, skin changes, nipple drainage, or lymphadenopathy. ABDOMEN: Soft, normal bowel sounds, no distention noted.  No tenderness, rebound or guarding.  BLADDER: Normal PELVIC:  External Genitalia: Normal  BUS: Normal  Vagina: Moderate atrophy  Cervix: 7 mm endocervical polyp, nonfriable; no cervical motion tenderness; polyp removed with tissular forceps  Uterus: Not examined due to biopsy  Adnexa: Not examined  RV: External Exam NormaI, No Rectal Masses and Normal Sphincter tone  MUSCULOSKELETAL: Normal range of motion. No tenderness.  No cyanosis, clubbing, or edema.  2+ distal pulses. LYMPHATIC: No Axillary, Supraclavicular, or Inguinal Adenopathy.  PROCEDURE: Cervical biopsy Consent is obtained.  Patient is placed in dorsolithotomy position.  Graves speculum is placed in the vagina to facilitate visualization of the cervix.  The cervical polyp is removed with tissular biopsy forceps.  Monsel solution is applied for hemostasis.  Bleeding is minimal.  Complications are none.  Procedure is well-tolerated. Assessment:   Annual gynecologic examination 57 y.o. Contraception: Post menopausal bmi- 24 Endocervical polyp, asymptomatic, removed today HRT therapy adequate  Plan:  Pap: due 2020 Mammogram: Due 09/2018 Stool Guaiac Testing:  Ordered Labs: thru pcp Routine preventative health maintenance measures emphasized: Exercise/Diet/Weight control, Tobacco Warnings and Alcohol/Substance use risks Continue Prempro 0.625/2.5 mg daily Continue calcium and vitamin D supplements Cervical biopsy is performed today; results will be made available through my chart Return to Clinic - 1 Year    Dorian PodJamie J [name], CMA   Britt BottomJamie [name] CMA acting as scribe for Dr. Greggory KeeneFrancesco. I have reviewed, updated, and concur with information scribed. Herold HarmsMartin A Reah Justo, MD  Note: This dictation was prepared with  Dragon dictation along with smaller phrase technology. Any transcriptional errors that result from this process are unintentional.

## 2018-02-20 ENCOUNTER — Ambulatory Visit (INDEPENDENT_AMBULATORY_CARE_PROVIDER_SITE_OTHER): Payer: BC Managed Care – PPO | Admitting: Obstetrics and Gynecology

## 2018-02-20 ENCOUNTER — Other Ambulatory Visit (HOSPITAL_COMMUNITY)
Admission: RE | Admit: 2018-02-20 | Discharge: 2018-02-20 | Disposition: A | Discharge status: Home / Self Care | Payer: BC Managed Care – PPO | Source: Ambulatory Visit | Attending: Obstetrics and Gynecology | Admitting: Obstetrics and Gynecology

## 2018-02-20 ENCOUNTER — Encounter: Payer: Self-pay | Admitting: Obstetrics and Gynecology

## 2018-02-20 VITALS — BP 150/80 | HR 55 | Ht 61.0 in | Wt 133.2 lb

## 2018-02-20 DIAGNOSIS — N951 Menopausal and female climacteric states: Secondary | ICD-10-CM

## 2018-02-20 DIAGNOSIS — Z01419 Encounter for gynecological examination (general) (routine) without abnormal findings: Secondary | ICD-10-CM

## 2018-02-20 DIAGNOSIS — N841 Polyp of cervix uteri: Secondary | ICD-10-CM | POA: Diagnosis present

## 2018-02-20 DIAGNOSIS — Z9889 Other specified postprocedural states: Secondary | ICD-10-CM

## 2018-02-20 MED ORDER — CONJ ESTROG-MEDROXYPROGEST ACE 0.625-2.5 MG PO TABS: 1.0000 | ORAL_TABLET | Freq: Every day | ORAL | 0 refills | Status: DC

## 2018-02-20 NOTE — Patient Instructions (Signed)
1.  Pap smear is not done.  Next Pap smear is due 2020. 2.  Cervical biopsy is done today-removal of endocervical polyp. 3.  Screening mammogram is ordered. 4.  Screening labs are done through primary care-Dr. Hall Busing. 5.  Stool guaiac cards are given for colon cancer screening. 6.  Continue with healthy eating and exercise. 7.  Continue with calcium and vitamin D supplementation twice daily. 8.  Continue with Prempro 0.65/2.5 mg daily. 9.  Return in 1 year for annual exam-Dr. Amalia Hailey 10.  Biopsy results will be available through my chart.   Health Maintenance for Postmenopausal Women Menopause is a normal process in which your reproductive ability comes to an end. This process happens gradually over a span of months to years, usually between the ages of 80 and 11. Menopause is complete when you have missed 12 consecutive menstrual periods. It is important to talk with your health care provider about some of the most common conditions that affect postmenopausal women, such as heart disease, cancer, and bone loss (osteoporosis). Adopting a healthy lifestyle and getting preventive care can help to promote your health and wellness. Those actions can also lower your chances of developing some of these common conditions. What should I know about menopause? During menopause, you may experience a number of symptoms, such as:  Moderate-to-severe hot flashes.  Night sweats.  Decrease in sex drive.  Mood swings.  Headaches.  Tiredness.  Irritability.  Memory problems.  Insomnia.  Choosing to treat or not to treat menopausal changes is an individual decision that you make with your health care provider. What should I know about hormone replacement therapy and supplements? Hormone therapy products are effective for treating symptoms that are associated with menopause, such as hot flashes and night sweats. Hormone replacement carries certain risks, especially as you become older. If you are  thinking about using estrogen or estrogen with progestin treatments, discuss the benefits and risks with your health care provider. What should I know about heart disease and stroke? Heart disease, heart attack, and stroke become more likely as you age. This may be due, in part, to the hormonal changes that your body experiences during menopause. These can affect how your body processes dietary fats, triglycerides, and cholesterol. Heart attack and stroke are both medical emergencies. There are many things that you can do to help prevent heart disease and stroke:  Have your blood pressure checked at least every 1-2 years. High blood pressure causes heart disease and increases the risk of stroke.  If you are 105-36 years old, ask your health care provider if you should take aspirin to prevent a heart attack or a stroke.  Do not use any tobacco products, including cigarettes, chewing tobacco, or electronic cigarettes. If you need help quitting, ask your health care provider.  It is important to eat a healthy diet and maintain a healthy weight. ? Be sure to include plenty of vegetables, fruits, low-fat dairy products, and lean protein. ? Avoid eating foods that are high in solid fats, added sugars, or salt (sodium).  Get regular exercise. This is one of the most important things that you can do for your health. ? Try to exercise for at least 150 minutes each week. The type of exercise that you do should increase your heart rate and make you sweat. This is known as moderate-intensity exercise. ? Try to do strengthening exercises at least twice each week. Do these in addition to the moderate-intensity exercise.  Know your numbers.Ask  your health care provider to check your cholesterol and your blood glucose. Continue to have your blood tested as directed by your health care provider.  What should I know about cancer screening? There are several types of cancer. Take the following steps to reduce  your risk and to catch any cancer development as early as possible. Breast Cancer  Practice breast self-awareness. ? This means understanding how your breasts normally appear and feel. ? It also means doing regular breast self-exams. Let your health care provider know about any changes, no matter how small.  If you are 81 or older, have a clinician do a breast exam (clinical breast exam or CBE) every year. Depending on your age, family history, and medical history, it may be recommended that you also have a yearly breast X-ray (mammogram).  If you have a family history of breast cancer, talk with your health care provider about genetic screening.  If you are at high risk for breast cancer, talk with your health care provider about having an MRI and a mammogram every year.  Breast cancer (BRCA) gene test is recommended for women who have family members with BRCA-related cancers. Results of the assessment will determine the need for genetic counseling and BRCA1 and for BRCA2 testing. BRCA-related cancers include these types: ? Breast. This occurs in males or females. ? Ovarian. ? Tubal. This may also be called fallopian tube cancer. ? Cancer of the abdominal or pelvic lining (peritoneal cancer). ? Prostate. ? Pancreatic.  Cervical, Uterine, and Ovarian Cancer Your health care provider may recommend that you be screened regularly for cancer of the pelvic organs. These include your ovaries, uterus, and vagina. This screening involves a pelvic exam, which includes checking for microscopic changes to the surface of your cervix (Pap test).  For women ages 21-65, health care providers may recommend a pelvic exam and a Pap test every three years. For women ages 67-65, they may recommend the Pap test and pelvic exam, combined with testing for human papilloma virus (HPV), every five years. Some types of HPV increase your risk of cervical cancer. Testing for HPV may also be done on women of any age who  have unclear Pap test results.  Other health care providers may not recommend any screening for nonpregnant women who are considered low risk for pelvic cancer and have no symptoms. Ask your health care provider if a screening pelvic exam is right for you.  If you have had past treatment for cervical cancer or a condition that could lead to cancer, you need Pap tests and screening for cancer for at least 20 years after your treatment. If Pap tests have been discontinued for you, your risk factors (such as having a new sexual partner) need to be reassessed to determine if you should start having screenings again. Some women have medical problems that increase the chance of getting cervical cancer. In these cases, your health care provider may recommend that you have screening and Pap tests more often.  If you have a family history of uterine cancer or ovarian cancer, talk with your health care provider about genetic screening.  If you have vaginal bleeding after reaching menopause, tell your health care provider.  There are currently no reliable tests available to screen for ovarian cancer.  Lung Cancer Lung cancer screening is recommended for adults 39-21 years old who are at high risk for lung cancer because of a history of smoking. A yearly low-dose CT scan of the lungs is recommended  if you:  Currently smoke.  Have a history of at least 30 pack-years of smoking and you currently smoke or have quit within the past 15 years. A pack-year is smoking an average of one pack of cigarettes per day for one year.  Yearly screening should:  Continue until it has been 15 years since you quit.  Stop if you develop a health problem that would prevent you from having lung cancer treatment.  Colorectal Cancer  This type of cancer can be detected and can often be prevented.  Routine colorectal cancer screening usually begins at age 18 and continues through age 22.  If you have risk factors for colon  cancer, your health care provider may recommend that you be screened at an earlier age.  If you have a family history of colorectal cancer, talk with your health care provider about genetic screening.  Your health care provider may also recommend using home test kits to check for hidden blood in your stool.  A small camera at the end of a tube can be used to examine your colon directly (sigmoidoscopy or colonoscopy). This is done to check for the earliest forms of colorectal cancer.  Direct examination of the colon should be repeated every 5-10 years until age 85. However, if early forms of precancerous polyps or small growths are found or if you have a family history or genetic risk for colorectal cancer, you may need to be screened more often.  Skin Cancer  Check your skin from head to toe regularly.  Monitor any moles. Be sure to tell your health care provider: ? About any new moles or changes in moles, especially if there is a change in a mole's shape or color. ? If you have a mole that is larger than the size of a pencil eraser.  If any of your family members has a history of skin cancer, especially at a young age, talk with your health care provider about genetic screening.  Always use sunscreen. Apply sunscreen liberally and repeatedly throughout the day.  Whenever you are outside, protect yourself by wearing long sleeves, pants, a wide-brimmed hat, and sunglasses.  What should I know about osteoporosis? Osteoporosis is a condition in which bone destruction happens more quickly than new bone creation. After menopause, you may be at an increased risk for osteoporosis. To help prevent osteoporosis or the bone fractures that can happen because of osteoporosis, the following is recommended:  If you are 29-74 years old, get at least 1,000 mg of calcium and at least 600 mg of vitamin D per day.  If you are older than age 70 but younger than age 65, get at least 1,200 mg of calcium and  at least 600 mg of vitamin D per day.  If you are older than age 62, get at least 1,200 mg of calcium and at least 800 mg of vitamin D per day.  Smoking and excessive alcohol intake increase the risk of osteoporosis. Eat foods that are rich in calcium and vitamin D, and do weight-bearing exercises several times each week as directed by your health care provider. What should I know about how menopause affects my mental health? Depression may occur at any age, but it is more common as you become older. Common symptoms of depression include:  Low or sad mood.  Changes in sleep patterns.  Changes in appetite or eating patterns.  Feeling an overall lack of motivation or enjoyment of activities that you previously enjoyed.  Frequent  crying spells.  Talk with your health care provider if you think that you are experiencing depression. What should I know about immunizations? It is important that you get and maintain your immunizations. These include:  Tetanus, diphtheria, and pertussis (Tdap) booster vaccine.  Influenza every year before the flu season begins.  Pneumonia vaccine.  Shingles vaccine.  Your health care provider may also recommend other immunizations. This information is not intended to replace advice given to you by your health care provider. Make sure you discuss any questions you have with your health care provider. Document Released: 04/28/2005 Document Revised: 09/24/2015 Document Reviewed: 12/08/2014 Elsevier Interactive Patient Education  2018 [employer].

## 2018-03-10 ENCOUNTER — Other Ambulatory Visit: Payer: Self-pay | Admitting: Obstetrics and Gynecology

## 2019-02-17 ENCOUNTER — Telehealth: Payer: Self-pay | Admitting: Obstetrics and Gynecology

## 2019-02-17 NOTE — Telephone Encounter (Signed)
The patient called and scheduled her annual with Dr. Marcelline Mates. Pt is also wanting to know if she is able to have her prescription PREMPRO 0.625-2.5 MG tablet [14467] refilled. The pt stated that she will be out at the end of the month. Please advise.

## 2019-02-19 MED ORDER — PREMPRO 0.625-5 MG PO TABS: 1.0000 | ORAL_TABLET | Freq: Every day | ORAL | 0 refills | Status: DC

## 2019-02-21 NOTE — Telephone Encounter (Signed)
Pt called to  see if pt received vm concerning refill of medication and to see if she had picked up her medication from the pharmacy. Pt stated that she was going to pick up her medication today.

## 2019-03-12 ENCOUNTER — Other Ambulatory Visit (HOSPITAL_COMMUNITY)
Admission: RE | Admit: 2019-03-12 | Discharge: 2019-03-12 | Disposition: A | Discharge status: Home / Self Care | Payer: BC Managed Care – PPO | Source: Ambulatory Visit | Attending: Obstetrics and Gynecology | Admitting: Obstetrics and Gynecology

## 2019-03-12 ENCOUNTER — Encounter: Payer: Self-pay | Admitting: Obstetrics and Gynecology

## 2019-03-12 ENCOUNTER — Ambulatory Visit (INDEPENDENT_AMBULATORY_CARE_PROVIDER_SITE_OTHER): Payer: BC Managed Care – PPO | Admitting: Obstetrics and Gynecology

## 2019-03-12 ENCOUNTER — Other Ambulatory Visit: Payer: Self-pay

## 2019-03-12 VITALS — BP 97/55 | HR 69 | Ht 61.0 in | Wt 130.2 lb

## 2019-03-12 DIAGNOSIS — F4321 Adjustment disorder with depressed mood: Secondary | ICD-10-CM

## 2019-03-12 DIAGNOSIS — Z01419 Encounter for gynecological examination (general) (routine) without abnormal findings: Secondary | ICD-10-CM

## 2019-03-12 DIAGNOSIS — Z1211 Encounter for screening for malignant neoplasm of colon: Secondary | ICD-10-CM | POA: Diagnosis not present

## 2019-03-12 DIAGNOSIS — Z124 Encounter for screening for malignant neoplasm of cervix: Secondary | ICD-10-CM | POA: Insufficient documentation

## 2019-03-12 DIAGNOSIS — E118 Type 2 diabetes mellitus with unspecified complications: Secondary | ICD-10-CM

## 2019-03-12 DIAGNOSIS — Z1231 Encounter for screening mammogram for malignant neoplasm of breast: Secondary | ICD-10-CM

## 2019-03-12 DIAGNOSIS — Z79890 Hormone replacement therapy (postmenopausal): Secondary | ICD-10-CM

## 2019-03-12 DIAGNOSIS — E7800 Pure hypercholesterolemia, unspecified: Secondary | ICD-10-CM

## 2019-03-12 MED ORDER — PREMPRO 0.625-2.5 MG PO TABS: 1.0000 | ORAL_TABLET | Freq: Every day | ORAL | 3 refills | Status: DC

## 2019-03-12 NOTE — Patient Instructions (Signed)
Preventive Care 40-59 Years Old, Female Preventive care refers to visits with your health care provider and lifestyle choices that can promote health and wellness. This includes:  A yearly physical exam. This may also be called an annual well check.  Regular dental visits and eye exams.  Immunizations.  Screening for certain conditions.  Healthy lifestyle choices, such as eating a healthy diet, getting regular exercise, not using drugs or products that contain nicotine and tobacco, and limiting alcohol use. What can I expect for my preventive care visit? Physical exam Your health care provider will check your:  Height and weight. This may be used to calculate body mass index (BMI), which tells if you are at a healthy weight.  Heart rate and blood pressure.  Skin for abnormal spots. Counseling Your health care provider may ask you questions about your:  Alcohol, tobacco, and drug use.  Emotional well-being.  Home and relationship well-being.  Sexual activity.  Eating habits.  Work and work environment.  Method of birth control.  Menstrual cycle.  Pregnancy history. What immunizations do I need?  Influenza (flu) vaccine  This is recommended every year. Tetanus, diphtheria, and pertussis (Tdap) vaccine  You may need a Td booster every 10 years. Varicella (chickenpox) vaccine  You may need this if you have not been vaccinated. Zoster (shingles) vaccine  You may need this after age 60. Measles, mumps, and rubella (MMR) vaccine  You may need at least one dose of MMR if you were born in 1957 or later. You may also need a second dose. Pneumococcal conjugate (PCV13) vaccine  You may need this if you have certain conditions and were not previously vaccinated. Pneumococcal polysaccharide (PPSV23) vaccine  You may need one or two doses if you smoke cigarettes or if you have certain conditions. Meningococcal conjugate (MenACWY) vaccine  You may need this if you  have certain conditions. Hepatitis A vaccine  You may need this if you have certain conditions or if you travel or work in places where you may be exposed to hepatitis A. Hepatitis B vaccine  You may need this if you have certain conditions or if you travel or work in places where you may be exposed to hepatitis B. Haemophilus influenzae type b (Hib) vaccine  You may need this if you have certain conditions. Human papillomavirus (HPV) vaccine  If recommended by your health care provider, you may need three doses over 6 months. You may receive vaccines as individual doses or as more than one vaccine together in one shot (combination vaccines). Talk with your health care provider about the risks and benefits of combination vaccines. What tests do I need? Blood tests  Lipid and cholesterol levels. These may be checked every 5 years, or more frequently if you are over 50 years old.  Hepatitis C test.  Hepatitis B test. Screening  Lung cancer screening. You may have this screening every year starting at age 55 if you have a 30-pack-year history of smoking and currently smoke or have quit within the past 15 years.  Colorectal cancer screening. All adults should have this screening starting at age 50 and continuing until age 75. Your health care provider may recommend screening at age 45 if you are at increased risk. You will have tests every 1-10 years, depending on your results and the type of screening test.  Diabetes screening. This is done by checking your blood sugar (glucose) after you have not eaten for a while (fasting). You may have this   done every 1-3 years.  Mammogram. This may be done every 1-2 years. Talk with your health care provider about when you should start having regular mammograms. This may depend on whether you have a family history of breast cancer.  BRCA-related cancer screening. This may be done if you have a family history of breast, ovarian, tubal, or peritoneal  cancers.  Pelvic exam and Pap test. This may be done every 3 years starting at age 23. Starting at age 86, this may be done every 5 years if you have a Pap test in combination with an HPV test. Other tests  Sexually transmitted disease (STD) testing.  Bone density scan. This is done to screen for osteoporosis. You may have this scan if you are at high risk for osteoporosis. Follow these instructions at home: Eating and drinking  Eat a diet that includes fresh fruits and vegetables, whole grains, lean protein, and low-fat dairy.  Take vitamin and mineral supplements as recommended by your health care provider.  Do not drink alcohol if: ? Your health care provider tells you not to drink. ? You are pregnant, may be pregnant, or are planning to become pregnant.  If you drink alcohol: ? Limit how much you have to 0-1 drink a day. ? Be aware of how much alcohol is in your drink. In the [place] one drink equals one 12 oz bottle of beer (355 mL), one 5 oz glass of wine (148 mL), or one 1 oz glass of hard liquor (44 mL). Lifestyle  Take daily care of your teeth and gums.  Stay active. Exercise for at least 30 minutes on 5 or more days each week.  Do not use any products that contain nicotine or tobacco, such as cigarettes, e-cigarettes, and chewing tobacco. If you need help quitting, ask your health care provider.  If you are sexually active, practice safe sex. Use a condom or other form of birth control (contraception) in order to prevent pregnancy and STIs (sexually transmitted infections).  If told by your health care provider, take low-dose aspirin daily starting at age 27. What's next?  Visit your health care provider once a year for a well check visit.  Ask your health care provider how often you should have your eyes and teeth checked.  Stay up to date on all vaccines. This information is not intended to replace advice given to you by your health care provider. Make sure you  discuss any questions you have with your health care provider. Document Released: 04/02/2015 Document Revised: 11/15/2017 Document Reviewed: 11/15/2017 Elsevier Patient Education  2020 [place] self-awareness is knowing how your breasts look and feel. Doing breast self-awareness is important. It allows you to catch a breast problem early while it is still small and can be treated. All women should do breast self-awareness, including women who have had breast implants. Tell your doctor if you notice a change in your breasts. What you need:  A mirror.  A well-lit room. How to do a breast self-exam A breast self-exam is one way to learn what is normal for your breasts and to check for changes. To do a breast self-exam: Look for changes  1. Take off all the clothes above your waist. 2. Stand in front of a mirror in a room with good lighting. 3. Put your hands on your hips. 4. Push your hands down. 5. Look at your breasts and nipples in the mirror to see if one breast or nipple looks  different from the other. Check to see if: ? The shape of one breast is different. ? The size of one breast is different. ? There are wrinkles, dips, and bumps in one breast and not the other. 6. Look at each breast for changes in the skin, such as: ? Redness. ? Scaly areas. 7. Look for changes in your nipples, such as: ? Liquid around the nipples. ? Bleeding. ? Dimpling. ? Redness. ? A change in where the nipples are. Feel for changes  1. Lie on your back on the floor. 2. Feel each breast. To do this, follow these steps: ? Pick a breast to feel. ? Put the arm closest to that breast above your head. ? Use your other arm to feel the nipple area of your breast. Feel the area with the pads of your three middle fingers by making small circles with your fingers. For the first circle, press lightly. For the second circle, press harder. For the third circle, press even harder.  ? Keep making circles with your fingers at the different pressures as you move down your breast. Stop when you feel your ribs. ? Move your fingers a little toward the center of your body. ? Start making circles with your fingers again, this time going up until you reach your collarbone. ? Keep making up-and-down circles until you reach your armpit. Remember to keep using the three pressures. ? Feel the other breast in the same way. 3. Sit or stand in the tub or shower. 4. With soapy water on your skin, feel each breast the same way you did in step 2 when you were lying on the floor. Write down what you find Writing down what you find can help you remember what to tell your doctor. Write down:  What is normal for each breast.  Any changes you find in each breast, including: ? The kind of changes you find. ? Whether you have pain. ? Size and location of any lumps.  When you last had your menstrual period. General tips  Check your breasts every month.  If you are breastfeeding, the best time to check your breasts is after you feed your baby or after you use a breast pump.  If you get menstrual periods, the best time to check your breasts is 5-7 days after your menstrual period is over.  With time, you will become comfortable with the self-exam, and you will begin to know if there are changes in your breasts. Contact a doctor if you:  See a change in the shape or size of your breasts or nipples.  See a change in the skin of your breast or nipples, such as red or scaly skin.  Have fluid coming from your nipples that is not normal.  Find a lump or thick area that was not there before.  Have pain in your breasts.  Have any concerns about your breast health. Summary  Breast self-awareness includes looking for changes in your breasts, as well as feeling for changes within your breasts.  Breast self-awareness should be done in front of a mirror in a well-lit room.  You should  check your breasts every month. If you get menstrual periods, the best time to check your breasts is 5-7 days after your menstrual period is over.  Let your doctor know of any changes you see in your breasts, including changes in size, changes on the skin, pain or tenderness, or fluid from your nipples that is not normal. This   information is not intended to replace advice given to you by your health care provider. Make sure you discuss any questions you have with your health care provider. Document Released: 08/23/2007 Document Revised: 10/23/2017 Document Reviewed: 10/23/2017 Elsevier Patient Education  2020 [employer].

## 2019-03-12 NOTE — Progress Notes (Signed)
ANNUAL PREVENTATIVE CARE GYNECOLOGY  ENCOUNTER NOTE  Subjective:       Caitlyn [name] is a 58 y.o. [phone] [id number] female here for a routine annual gynecologic exam. T The patient is taking hormone replacement therapy (Prempro, has been on for several years for severe vasomotor symptoms). Patient denies post-menopausal vaginal bleeding. The patient wears seatbelts: yes.   Current concerns: 1.  Notes death of ex-husband ~ 2 weeks ago (suicide).  They have been divorced for ~ 2 years, were married for over 30 years. Ex-husband suffered from mental illness. Notes she and her family are coping.  Have family support and and are remaining prayerful, are thankful that he is no longer suffering.    Gynecologic History No LMP recorded. Patient is postmenopausal. Contraception: post menopausal status Last Pap: 11/03/2015. Results were: normal Last mammogram: 09/2017. Results were: normal Last Colonoscopy: never had one. Patient does yearly stool guaiac cards. Last testing normal.  Last Dexa Scan: None   Obstetric History OB History  Gravida Para Term Preterm AB Living  5 2 2   3 2   SAB TAB Ectopic Multiple Live Births  3       2    # Outcome Date GA Lbr Len/2nd Weight Sex Delivery Anes PTL Lv  5 Term 1990   7 lb 1.8 oz (3.225 kg) F CS-LTranv   LIV  4 Term 1984   7 lb 1.8 oz (3.225 kg) M CS-LTranv   LIV  3 SAB           2 SAB           1 SAB             Past Medical History:  Diagnosis Date  . Anxiety   . Arthritis   . Asthma   . Constipation   . Cystocele   . GERD (gastroesophageal reflux disease)   . Hypertension   . Increased BMI   . Menopause   . Menorrhagia   . Seasonal allergies   . Stress incontinence     Family History  Problem Relation Age of Onset  . Breast cancer Maternal Aunt 35  . Colon cancer Paternal Grandfather   . Diabetes Paternal Grandfather   . Ovarian cancer Neg Hx   . Heart disease Neg Hx     Past Surgical History:  Procedure Laterality Date  .  ABLATION    . CARPAL TUNNEL RELEASE    . CESAREAN SECTION     x 2  . DILATION AND CURETTAGE OF UTERUS    . HYSTEROSCOPY    . LAPAROSCOPIC GASTRIC SLEEVE RESECTION      Social History   Socioeconomic History  . Marital status: Married    Spouse name: Not on file  . Number of children: Not on file  . Years of education: Not on file  . Highest education level: Not on file  Occupational History  . Not on file  Tobacco Use  . Smoking status: Never Smoker  . Smokeless tobacco: Never Used  Substance and Sexual Activity  . Alcohol use: Yes    Comment: 2 month  . Drug use: No  . Sexual activity: Not Currently    Birth control/protection: Post-menopausal  Other Topics Concern  . Not on file  Social History Narrative  . Not on file   Social Determinants of Health   Financial Resource Strain:   . Difficulty of Paying Living Expenses: Not on file  Food Insecurity:   . Worried  About Running Out of Food in the Last Year: Not on file  . Ran Out of Food in the Last Year: Not on file  Transportation Needs:   . Lack of Transportation (Medical): Not on file  . Lack of Transportation (Non-Medical): Not on file  Physical Activity:   . Days of Exercise per Week: Not on file  . Minutes of Exercise per Session: Not on file  Stress:   . Feeling of Stress : Not on file  Social Connections:   . Frequency of Communication with Friends and Family: Not on file  . Frequency of Social Gatherings with Friends and Family: Not on file  . Attends Religious Services: Not on file  . Active Member of Clubs or Organizations: Not on file  . Attends Banker Meetings: Not on file  . Marital Status: Not on file  Intimate Partner Violence:   . Fear of Current or Ex-Partner: Not on file  . Emotionally Abused: Not on file  . Physically Abused: Not on file  . Sexually Abused: Not on file    Current Outpatient Medications on File Prior to Visit  Medication Sig Dispense Refill  . albuterol  (PROVENTIL HFA;VENTOLIN HFA) 108 (90 Base) MCG/ACT inhaler Inhale into the lungs every 6 (six) hours as needed for wheezing or shortness of breath.    Marland Kitchen atomoxetine (STRATTERA) 80 MG capsule atomoxetine 80 mg capsule    . budesonide-formoterol (SYMBICORT) 160-4.5 MCG/ACT inhaler Inhale 2 puffs into the lungs 2 (two) times daily.    . calcium-vitamin D (OSCAL WITH D) 250-125 MG-UNIT tablet Take 1 tablet by mouth daily.    . celecoxib (CELEBREX) 100 MG capsule Take 100 mg by mouth 2 (two) times daily.    . cetirizine (ZYRTEC) 10 MG tablet Take 10 mg by mouth daily.    . fluticasone (FLONASE) 50 MCG/ACT nasal spray Place into both nostrils daily.    Marland Kitchen labetalol (NORMODYNE) 100 MG tablet Take 100 mg by mouth 2 (two) times daily.    . montelukast (SINGULAIR) 10 MG tablet Take 10 mg by mouth at bedtime.    Marland Kitchen PREMPRO 0.625-2.5 MG tablet TAKE 1 TABLET BY MOUTH ONCE DAILY 28 tablet 11  . estrogen, conjugated,-medroxyprogesterone (PREMPRO) 0.625-5 MG tablet Take 1 tablet by mouth daily. (Patient not taking: Reported on 03/12/2019) 30 tablet 0   No current facility-administered medications on file prior to visit.    No Known Allergies    Review of Systems ROS Review of Systems - General ROS: negative for - chills, fatigue, fever, hot flashes, night sweats, weight gain or weight loss Psychological ROS: negative for - anxiety, decreased libido, depression, mood swings, physical abuse or sexual abuse Ophthalmic ROS: negative for - blurry vision, eye pain or loss of vision ENT ROS: negative for - headaches, hearing change, visual changes or vocal changes Allergy and Immunology ROS: negative for - hives, itchy/watery eyes or seasonal allergies Hematological and Lymphatic ROS: negative for - bleeding problems, bruising, swollen lymph nodes or weight loss Endocrine ROS: negative for - galactorrhea, hair pattern changes, hot flashes, malaise/lethargy, mood swings, palpitations, polydipsia/polyuria, skin  changes, temperature intolerance or unexpected weight changes Breast ROS: negative for - new or changing breast lumps or nipple discharge Respiratory ROS: negative for - cough or shortness of breath Cardiovascular ROS: negative for - chest pain, irregular heartbeat, palpitations or shortness of breath Gastrointestinal ROS: no abdominal pain, change in bowel habits, or black or bloody stools Genito-Urinary ROS: no dysuria, trouble voiding, or  hematuria Musculoskeletal ROS: negative for - joint pain or joint stiffness Neurological ROS: negative for - bowel and bladder control changes Dermatological ROS: negative for rash and skin lesion changes   Objective:   BP (!) 97/55   Pulse 69   Ht 5\' 1"  (1.549 m)   Wt 130 lb 3.2 oz (59.1 kg)   BMI 24.60 kg/m  CONSTITUTIONAL: Well-developed, well-nourished female in no acute distress.  PSYCHIATRIC: Normal mood and affect. Normal behavior. Normal judgment and thought content. [place]: Alert and oriented to person, place, and time. Normal muscle tone coordination. No cranial nerve deficit noted. HENT:  Normocephalic, atraumatic, External right and left ear normal. Oropharynx is clear and moist EYES: Conjunctivae and EOM are normal. Pupils are equal, round, and reactive to light. No scleral icterus.  NECK: Normal range of motion, supple, no masses.  Normal thyroid.  SKIN: Skin is warm and dry. No rash noted. Not diaphoretic. No erythema. No pallor. CARDIOVASCULAR: Normal heart rate noted, regular rhythm, no murmur. RESPIRATORY: Clear to auscultation bilaterally. Effort and breath sounds normal, no problems with respiration noted. BREASTS: Symmetric in size. No masses, skin changes, nipple drainage, or lymphadenopathy. ABDOMEN: Soft, normal bowel sounds, no distention noted.  No tenderness, rebound or guarding.  BLADDER: Normal PELVIC:  Bladder no bladder distension noted  Urethra: normal appearing urethra with no masses, tenderness or  lesions  Vulva: normal appearing vulva with no masses, tenderness or lesions  Vagina: normal appearing vagina with normal color and discharge, no lesions  Cervix: normal appearing cervix without discharge or lesions  Uterus: uterus is normal size, shape, consistency and nontender  Adnexa: normal adnexa in size, nontender and no masses  RV: External Exam NormaI, No Rectal Masses and Normal Sphincter tone  MUSCULOSKELETAL: Normal range of motion. No tenderness.  No cyanosis, clubbing, or edema.  2+ distal pulses. LYMPHATIC: No Axillary, Supraclavicular, or Inguinal Adenopathy.   Labs: None.   Assessment:    1. Well woman exam with routine gynecological exam   2. Breast cancer screening by mammogram   3. Colon cancer screening   4. Cervical cancer screening   5. Grief   6. Post-menopause on HRT (hormone replacement therapy)   7. Controlled type 2 diabetes mellitus with complication, without long-term current use of insulin ([place])   8. Pure hypercholesterolemia      Plan:  Pap: Pap Co Test performed today. Mammogram: Ordered Stool Guaiac Testing:  Ordered Labs: Done by PCP. Due in February.  Routine preventative health maintenance measures emphasized: Calcium and Vitamin D supplementation, Exercise/Diet/Weight control, Tobacco Warnings, Alcohol/Substance use risks, Stress Management, Peer Pressure Issues and Safe Sex. Flu vaccine up to date. Plans to get COVID-19 vaccine when available.  Appropriately grieving at this time. Can contact office for follow up if she feels that grief has progressed into depression/anxiety.  Desires refill on Prempro. Understands risks of continued use.  Notes that she does not "feel right" when she is not on the medication, as well as helping her vasomotor symptoms.  PCP managing DM and hypercholesterolemia.  Return to [place], MD  [facility]

## 2019-03-12 NOTE — Progress Notes (Signed)
Pt is present for annual exam. Pt stated that she was doing well and denies any issues at this time. Pt had flu vaccine 12/2020.

## 2019-03-17 ENCOUNTER — Telehealth: Payer: Self-pay | Admitting: Obstetrics and Gynecology

## 2019-03-17 NOTE — Telephone Encounter (Signed)
timeka from [facility] called in about a order for a pap it needs to be now it was future dated. Please advise

## 2019-03-19 LAB — CYTOLOGY - PAP
Comment: NEGATIVE
Diagnosis: NEGATIVE
High risk HPV: NEGATIVE

## 2019-03-20 NOTE — Addendum Note (Signed)
Addended by: Edwyna Shell on: 03/20/2019 08:59 AM   Modules accepted: Orders

## 2019-03-20 NOTE — Telephone Encounter (Signed)
Completed.

## 2019-04-08 ENCOUNTER — Other Ambulatory Visit: Payer: Self-pay | Admitting: Obstetrics and Gynecology

## 2019-05-18 ENCOUNTER — Ambulatory Visit: Payer: BC Managed Care – PPO | Attending: Internal Medicine

## 2019-05-18 DIAGNOSIS — Z23 Encounter for immunization: Secondary | ICD-10-CM | POA: Insufficient documentation

## 2019-05-18 NOTE — Progress Notes (Signed)
   Covid-19 Vaccination Clinic  Name:  Caitlyn [name]    MRN: [id number] DOB: 01-Jan-1961  05/18/2019  [name] was observed post Covid-19 immunization for 15 minutes without incidence. She was provided with Vaccine Information Sheet and instruction to access the V-Safe system.   [name] was instructed to call 911 with any severe reactions post vaccine: Marland Kitchen Difficulty breathing  . Swelling of your face and throat  . A fast heartbeat  . A bad rash all over your body  . Dizziness and weakness    Immunizations Administered    Name Date Dose VIS Date Route   Pfizer COVID-19 Vaccine 05/18/2019  2:01 PM 0.3 mL 02/28/2019 Intramuscular   Manufacturer: [employer], [employer]   Lot: [id number]   NDC: [address]-1

## 2019-06-11 ENCOUNTER — Ambulatory Visit: Payer: BC Managed Care – PPO | Attending: Internal Medicine

## 2019-06-11 DIAGNOSIS — Z23 Encounter for immunization: Secondary | ICD-10-CM

## 2019-06-11 NOTE — Progress Notes (Signed)
   Covid-19 Vaccination Clinic  Name:  MERINDA VICTORINO    MRN: [id number] DOB: 1960-04-30  06/11/2019  Ms. Wence was observed post Covid-19 immunization for 15 minutes without incident. She was provided with Vaccine Information Sheet and instruction to access the V-Safe system.   Ms. Krenzer was instructed to call 911 with any severe reactions post vaccine: Marland Kitchen Difficulty breathing  . Swelling of face and throat  . A fast heartbeat  . A bad rash all over body  . Dizziness and weakness   Immunizations Administered    Name Date Dose VIS Date Route   Pfizer COVID-19 Vaccine 06/11/2019  3:28 PM 0.3 mL 02/28/2019 Intramuscular   Manufacturer: [employer], [employer]   Lot: [id number]   NDC: [address]-0

## 2020-03-30 ENCOUNTER — Encounter: Payer: BC Managed Care – PPO | Admitting: Obstetrics and Gynecology

## 2020-03-30 DIAGNOSIS — Z124 Encounter for screening for malignant neoplasm of cervix: Secondary | ICD-10-CM

## 2020-03-30 DIAGNOSIS — Z01419 Encounter for gynecological examination (general) (routine) without abnormal findings: Secondary | ICD-10-CM

## 2020-03-30 DIAGNOSIS — Z1231 Encounter for screening mammogram for malignant neoplasm of breast: Secondary | ICD-10-CM

## 2020-04-28 ENCOUNTER — Other Ambulatory Visit: Payer: Self-pay | Admitting: Obstetrics and Gynecology

## 2020-06-30 ENCOUNTER — Other Ambulatory Visit: Payer: Self-pay

## 2020-06-30 ENCOUNTER — Telehealth: Payer: Self-pay | Admitting: Obstetrics and Gynecology

## 2020-06-30 MED ORDER — PREMPRO 0.625-2.5 MG PO TABS: 1.0000 | ORAL_TABLET | Freq: Every day | ORAL | 3 refills | Status: DC

## 2020-06-30 NOTE — Telephone Encounter (Signed)
Spoke to pt. Pt is aware that her medication has been refilled until her appt with [facility] in June. Pt rescheduled her annual exam.

## 2020-06-30 NOTE — Telephone Encounter (Signed)
Caitlyn [name] called to reschedule her physical for tomorrow (06/30/20).  When I told her the next availability for a physical was June 29th, Caitlyn [name] stated that she needed refills on PremPro and if Dr. Valentino Saxon would fill that for her until the June appointment she would make it.  If Dr. Valentino Saxon is not willing to fill it until she is seen, then she would keep her appointment for tomorrow.  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  Caitlyn [name] would like a phone call to let her know either way about the prescription.  Caitlyn [name] states she has enough to hold her over through the end of April.  Caitlyn [name] also states she would like it called in to [facility] in [place].

## 2020-07-01 ENCOUNTER — Encounter: Payer: Self-pay | Admitting: Obstetrics and Gynecology

## 2020-07-06 ENCOUNTER — Telehealth: Payer: Self-pay | Admitting: Obstetrics and Gynecology

## 2020-07-06 NOTE — Telephone Encounter (Signed)
Pt called asking if she could get an order placed for a mammogram? Her next physical is 06-30.Please Advise.

## 2020-07-07 ENCOUNTER — Other Ambulatory Visit: Payer: Self-pay

## 2020-07-07 DIAGNOSIS — Z1231 Encounter for screening mammogram for malignant neoplasm of breast: Secondary | ICD-10-CM

## 2020-07-07 NOTE — Telephone Encounter (Signed)
Pt called no answer LM via VM that her mammogram order has been placed.

## 2020-08-10 ENCOUNTER — Other Ambulatory Visit: Payer: Self-pay

## 2020-08-10 ENCOUNTER — Ambulatory Visit
Admission: RE | Admit: 2020-08-10 | Discharge: 2020-08-10 | Disposition: A | Discharge status: Home / Self Care | Payer: BC Managed Care – PPO | Source: Ambulatory Visit | Attending: Obstetrics and Gynecology | Admitting: Obstetrics and Gynecology

## 2020-08-10 DIAGNOSIS — Z1231 Encounter for screening mammogram for malignant neoplasm of breast: Secondary | ICD-10-CM | POA: Insufficient documentation

## 2020-09-16 ENCOUNTER — Ambulatory Visit (INDEPENDENT_AMBULATORY_CARE_PROVIDER_SITE_OTHER): Payer: BC Managed Care – PPO | Admitting: Obstetrics and Gynecology

## 2020-09-16 ENCOUNTER — Other Ambulatory Visit: Payer: Self-pay

## 2020-09-16 VITALS — BP 114/69 | HR 73 | Ht 61.0 in | Wt 143.8 lb

## 2020-09-16 DIAGNOSIS — Z79890 Hormone replacement therapy (postmenopausal): Secondary | ICD-10-CM

## 2020-09-16 DIAGNOSIS — E118 Type 2 diabetes mellitus with unspecified complications: Secondary | ICD-10-CM | POA: Diagnosis not present

## 2020-09-16 DIAGNOSIS — Z01419 Encounter for gynecological examination (general) (routine) without abnormal findings: Secondary | ICD-10-CM | POA: Diagnosis not present

## 2020-09-16 DIAGNOSIS — Z1211 Encounter for screening for malignant neoplasm of colon: Secondary | ICD-10-CM

## 2020-09-16 DIAGNOSIS — E7800 Pure hypercholesterolemia, unspecified: Secondary | ICD-10-CM

## 2020-09-16 MED ORDER — PREMPRO 0.625-5 MG PO TABS: 1.0000 | ORAL_TABLET | Freq: Every day | ORAL | 3 refills | Status: DC

## 2020-09-16 NOTE — Progress Notes (Signed)
ANNUAL PREVENTATIVE CARE GYNECOLOGY  ENCOUNTER NOTE  Subjective:       Caitlyn [name] is a 60 y.o. [phone] female here for a routine annual gynecologic exam. The patient is taking hormone replacement therapy (Prempro, has been on for several years for severe vasomotor symptoms). Patient denies post-menopausal vaginal bleeding. The patient wears seatbelts: yes.   Current concerns: 1.  Patient complains of flashes approximately 2-3 times per week despite being on hormone replacement therapy.  This has been going on for several months.   Gynecologic History No LMP recorded. Patient is postmenopausal. Contraception: post menopausal status Last Pap: 03/12/2019. Results were: normal Last mammogram: 08/10/2020. Results were: normal Last Colonoscopy: never had one. Patient does yearly stool guaiac cards. Last Dexa Scan: None   Obstetric History OB History  Gravida Para Term Preterm AB Living  5 2 2   3 2   SAB IAB Ectopic Multiple Live Births  3       2    # Outcome Date GA Lbr Len/2nd Weight Sex Delivery Anes PTL Lv  5 Term 1990   7 lb 1.8 oz (3.225 kg) F CS-LTranv   LIV  4 Term 1984   7 lb 1.8 oz (3.225 kg) M CS-LTranv   LIV  3 SAB           2 SAB           1 SAB             Past Medical History:  Diagnosis Date   Anxiety    Arthritis    Asthma    Constipation    Cystocele    GERD (gastroesophageal reflux disease)    Hypertension    Increased BMI    Menopause    Menorrhagia    Seasonal allergies    Stress incontinence     Family History  Problem Relation Age of Onset   Breast cancer Maternal Aunt 35   Colon cancer Paternal Grandfather    Diabetes Paternal Grandfather    Ovarian cancer Neg Hx    Heart disease Neg Hx     Past Surgical History:  Procedure Laterality Date   ABLATION     CARPAL TUNNEL RELEASE     CESAREAN SECTION     x 2   DILATION AND CURETTAGE OF UTERUS     HYSTEROSCOPY     LAPAROSCOPIC GASTRIC SLEEVE RESECTION      Social History    Socioeconomic History   Marital status: Married    Spouse name: Not on file   Number of children: Not on file   Years of education: Not on file   Highest education level: Not on file  Occupational History   Not on file  Tobacco Use   Smoking status: Never   Smokeless tobacco: Never  Vaping Use   Vaping Use: Never used  Substance and Sexual Activity   Alcohol use: Yes    Comment: 2 month   Drug use: No   Sexual activity: Not Currently    Birth control/protection: Post-menopausal  Other Topics Concern   Not on file  Social History Narrative   Not on file   Social Determinants of Health   Financial Resource Strain: Not on file  Food Insecurity: Not on file  Transportation Needs: Not on file  Physical Activity: Not on file  Stress: Not on file  Social Connections: Not on file  Intimate Partner Violence: Not on file    Current Outpatient Medications on  File Prior to Visit  Medication Sig Dispense Refill   albuterol (PROVENTIL HFA;VENTOLIN HFA) 108 (90 Base) MCG/ACT inhaler Inhale into the lungs every 6 (six) hours as needed for wheezing or shortness of breath.     atomoxetine (STRATTERA) 80 MG capsule atomoxetine 80 mg capsule     budesonide-formoterol (SYMBICORT) 160-4.5 MCG/ACT inhaler Inhale 2 puffs into the lungs 2 (two) times daily.     calcium-vitamin D (OSCAL WITH D) 250-125 MG-UNIT tablet Take 1 tablet by mouth daily.     celecoxib (CELEBREX) 100 MG capsule Take 100 mg by mouth 2 (two) times daily.     cetirizine (ZYRTEC) 10 MG tablet Take 10 mg by mouth daily.     fluticasone (FLONASE) 50 MCG/ACT nasal spray Place into both nostrils daily.     hydrALAZINE (APRESOLINE) 25 MG tablet Take 25 mg by mouth 3 (three) times daily.     hydrochlorothiazide (HYDRODIURIL) 25 MG tablet Take 25 mg by mouth daily.     irbesartan (AVAPRO) 300 MG tablet Take by mouth daily.     labetalol (NORMODYNE) 100 MG tablet Take 100 mg by mouth 2 (two) times daily.     montelukast  (SINGULAIR) 10 MG tablet Take 10 mg by mouth at bedtime.     No current facility-administered medications on file prior to visit.    No Known Allergies    Review of Systems ROS Review of Systems - General ROS: negative for - chills, fatigue, fever,  weight gain or weight loss. Positive for hot flashes, night sweats, Psychological ROS: negative for - anxiety, decreased libido, depression, mood swings, physical abuse or sexual abuse Ophthalmic ROS: negative for - blurry vision, eye pain or loss of vision ENT ROS: negative for - headaches, hearing change, visual changes or vocal changes Allergy and Immunology ROS: negative for - hives, itchy/watery eyes or seasonal allergies Hematological and Lymphatic ROS: negative for - bleeding problems, bruising, swollen lymph nodes or weight loss Endocrine ROS: negative for - galactorrhea, hair pattern changes, hot flashes, malaise/lethargy, mood swings, palpitations, polydipsia/polyuria, skin changes, temperature intolerance or unexpected weight changes Breast ROS: negative for - new or changing breast lumps or nipple discharge Respiratory ROS: negative for - cough or shortness of breath Cardiovascular ROS: negative for - chest pain, irregular heartbeat, palpitations or shortness of breath Gastrointestinal ROS: no abdominal pain, change in bowel habits, or black or bloody stools Genito-Urinary ROS: no dysuria, trouble voiding, or hematuria Musculoskeletal ROS: negative for - joint pain or joint stiffness Neurological ROS: negative for - bowel and bladder control changes Dermatological ROS: negative for rash and skin lesion changes   Objective:   BP 114/69   Pulse 73   Ht 5\' 1"  (1.549 m)   Wt 143 lb 12.8 oz (65.2 kg)   BMI 27.17 kg/m  CONSTITUTIONAL: Well-developed, well-nourished female in no acute distress. Overweight PSYCHIATRIC: Normal mood and affect. Normal behavior. Normal judgment and thought content. NEUROLGIC: Alert and oriented to  person, place, and time. Normal muscle tone coordination. No cranial nerve deficit noted. HENT:  Normocephalic, atraumatic, External right and left ear normal. Oropharynx is clear and moist EYES: Conjunctivae and EOM are normal. Pupils are equal, round, and reactive to light. No scleral icterus.  NECK: Normal range of motion, supple, no masses.  Normal thyroid.  SKIN: Skin is warm and dry. No rash noted. Not diaphoretic. No erythema. No pallor. CARDIOVASCULAR: Normal heart rate noted, regular rhythm, no murmur. RESPIRATORY: Clear to auscultation bilaterally. Effort and breath sounds normal,  no problems with respiration noted. BREASTS: Symmetric in size. No masses, skin changes, nipple drainage, or lymphadenopathy. ABDOMEN: Soft, normal bowel sounds, no distention noted.  No tenderness, rebound or guarding.  BLADDER: Normal PELVIC:  Bladder no bladder distension noted  Urethra: normal appearing urethra with no masses, tenderness or lesions  Vulva: normal appearing vulva with no masses, tenderness or lesions  Vagina: normal appearing vagina with normal color and discharge, no lesions  Cervix: normal appearing cervix without discharge or lesions  Uterus: uterus is normal size, shape, consistency and nontender  Adnexa: normal adnexa in size, nontender and no masses  RV: External Exam NormaI, No Rectal Masses and Normal Sphincter tone  MUSCULOSKELETAL: Normal range of motion. No tenderness.  No cyanosis, clubbing, or edema.  2+ distal pulses. LYMPHATIC: No Axillary, Supraclavicular, or Inguinal Adenopathy.   Labs: None.   Assessment:    1. Well woman exam with routine gynecological exam   2. Colon cancer screening   3. Post-menopause on HRT (hormone replacement therapy)   4. Controlled type 2 diabetes mellitus with complication, without long-term current use of insulin (HCC)   5. Pure hypercholesterolemia     Plan:  Pap: not performed.  Is up-to-date. Mammogram:  Up to date. Colon  screening: Discussion had with patient regarding other options for colon cancer screening including colonoscopy and Cologuard.  After discussion of risk versus benefits, patient notes that she is willing to try the Cologuard.  We will order. Labs: Done by PCP.  Routine preventative health maintenance measures emphasized:  Calcium and Vitamin D supplementation, Exercise/Diet/Weight control, Tobacco Warnings, Alcohol/Substance use risks, Stress Management, Peer Pressure Issues and Safe Sex. Desires refill on Prempro. Understands risks of continued use.  Is still reporting symptoms flashes and night sweats on current medication dose.  I discussed the option of supplementing with her.  Remedies, increasing dose of current medication, or changing to a different therapy.  Patient okay to increase dose for now. PCP managing DM and hypercholesterolemia.  COVID vaccination status: Up-to-date.  Is eligible for booster. Return to Clinic - 1 Year   Hildred Laser, MD  [facility]

## 2020-09-19 ENCOUNTER — Encounter: Payer: Self-pay | Admitting: Obstetrics and Gynecology

## 2020-10-05 LAB — COLOGUARD: Cologuard: NEGATIVE

## 2020-11-18 HISTORY — PX: ROTATOR CUFF REPAIR: SHX139

## 2021-09-21 ENCOUNTER — Encounter: Payer: BC Managed Care – PPO | Admitting: Obstetrics and Gynecology
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
## 2021-10-08 ENCOUNTER — Other Ambulatory Visit: Payer: Self-pay | Admitting: Obstetrics and Gynecology

## 2021-10-10 ENCOUNTER — Telehealth: Payer: Self-pay | Admitting: Obstetrics and Gynecology

## 2021-10-10 ENCOUNTER — Other Ambulatory Visit: Payer: Self-pay | Admitting: Obstetrics and Gynecology

## 2021-10-10 DIAGNOSIS — Z1231 Encounter for screening mammogram for malignant neoplasm of breast: Secondary | ICD-10-CM

## 2021-10-10 NOTE — Telephone Encounter (Signed)
Pt is requesting a refill on the RX Prepro prior to her appt. Her annual was rescheduled for 09/13

## 2021-10-10 NOTE — Telephone Encounter (Signed)
Patient needs annual exam prior to next refill.  

## 2021-10-11 NOTE — Telephone Encounter (Signed)
Routed to provider for confirmation of medication, no active prescription on file.

## 2021-10-14 ENCOUNTER — Other Ambulatory Visit: Payer: Self-pay

## 2021-11-02 ENCOUNTER — Ambulatory Visit
Admission: RE | Admit: 2021-11-02 | Discharge: 2021-11-02 | Disposition: A | Discharge status: Home / Self Care | Payer: BC Managed Care – PPO | Source: Ambulatory Visit | Attending: Obstetrics and Gynecology | Admitting: Obstetrics and Gynecology

## 2021-11-02 DIAGNOSIS — Z1231 Encounter for screening mammogram for malignant neoplasm of breast: Secondary | ICD-10-CM | POA: Insufficient documentation

## 2021-11-03 ENCOUNTER — Other Ambulatory Visit: Payer: Self-pay | Admitting: Obstetrics and Gynecology

## 2021-11-03 DIAGNOSIS — R928 Other abnormal and inconclusive findings on diagnostic imaging of breast: Secondary | ICD-10-CM

## 2021-11-04 ENCOUNTER — Other Ambulatory Visit: Payer: Self-pay | Admitting: Obstetrics and Gynecology

## 2021-11-04 DIAGNOSIS — R928 Other abnormal and inconclusive findings on diagnostic imaging of breast: Secondary | ICD-10-CM

## 2021-11-04 DIAGNOSIS — N6489 Other specified disorders of breast: Secondary | ICD-10-CM

## 2021-11-09 ENCOUNTER — Ambulatory Visit
Admission: RE | Admit: 2021-11-09 | Discharge: 2021-11-09 | Disposition: A | Discharge status: Home / Self Care | Payer: BC Managed Care – PPO | Source: Ambulatory Visit | Attending: Obstetrics and Gynecology | Admitting: Obstetrics and Gynecology

## 2021-11-09 DIAGNOSIS — N6489 Other specified disorders of breast: Secondary | ICD-10-CM | POA: Insufficient documentation

## 2021-11-09 DIAGNOSIS — R928 Other abnormal and inconclusive findings on diagnostic imaging of breast: Secondary | ICD-10-CM | POA: Diagnosis present

## 2021-11-23 ENCOUNTER — Other Ambulatory Visit: Payer: BC Managed Care – PPO

## 2021-11-30 ENCOUNTER — Encounter: Payer: BC Managed Care – PPO | Admitting: Obstetrics and Gynecology

## 2021-12-14 ENCOUNTER — Ambulatory Visit: Payer: BC Managed Care – PPO | Admitting: Dermatology

## 2021-12-27 IMAGING — MG MM DIGITAL SCREENING BILAT W/ TOMO AND CAD
6 of 10 series · 6 of 30 positions shown · non-contrast
Comparison: Previous exam(s).

CLINICAL DATA: Screening.

EXAM:
DIGITAL SCREENING BILATERAL MAMMOGRAM WITH TOMOSYNTHESIS AND CAD
TECHNIQUE: Bilateral screening digital craniocaudal and mediolateral oblique
mammograms were obtained. Bilateral screening digital breast
tomosynthesis was performed. The images were evaluated with
computer-aided detection.

[R CC synth-2D (1 of 2)]
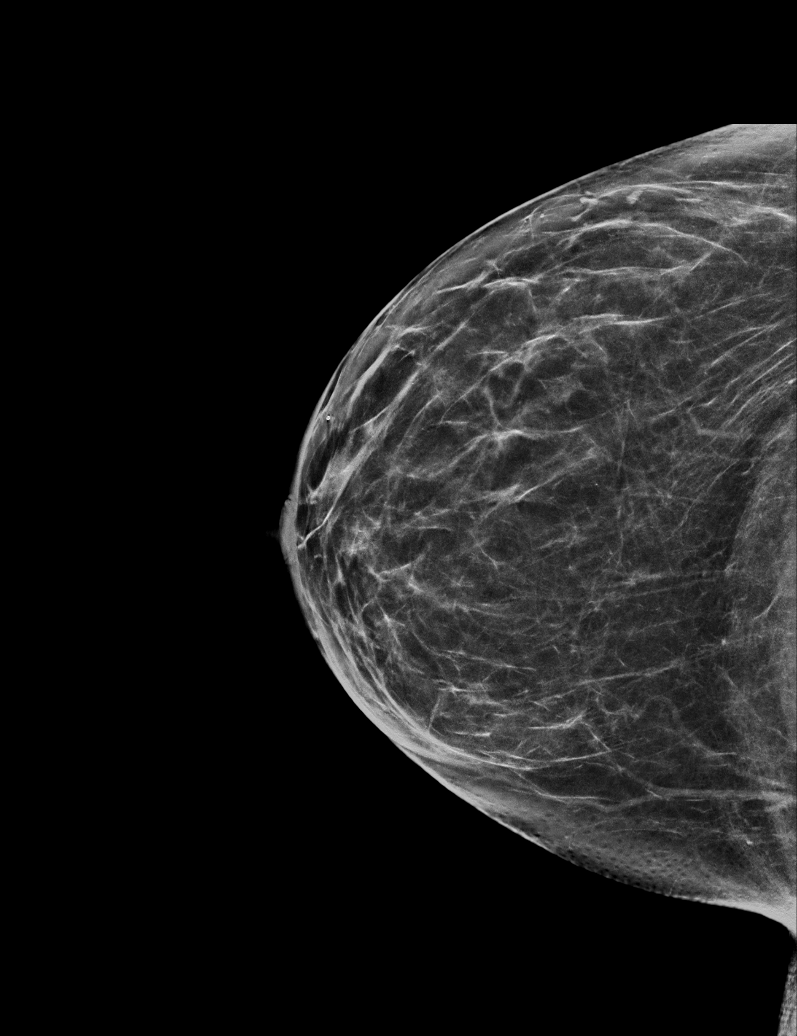

[R MLO synth-2D]
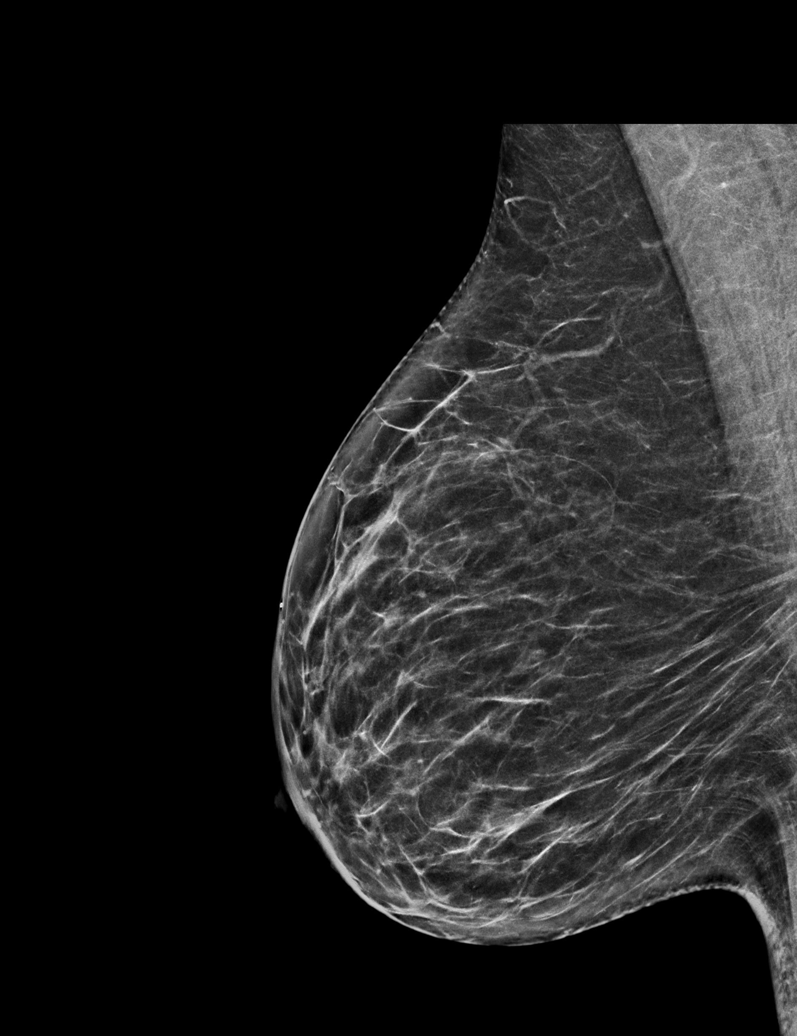

[L MLO synth-2D]
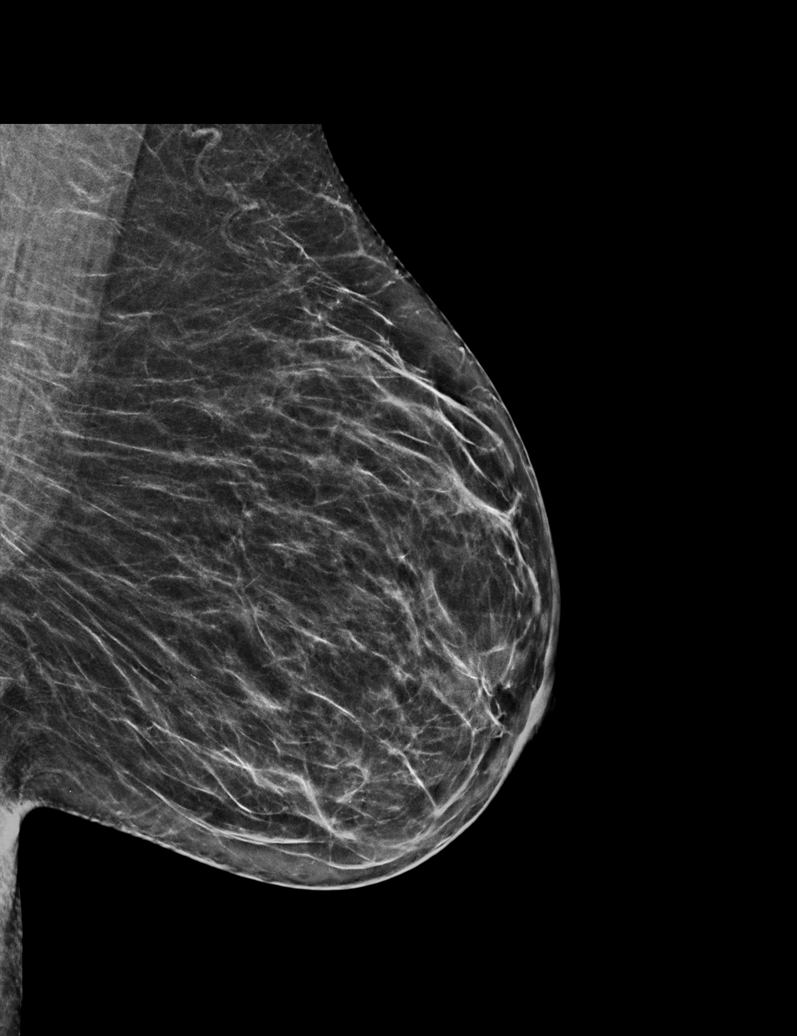

[L CC synth-2D]
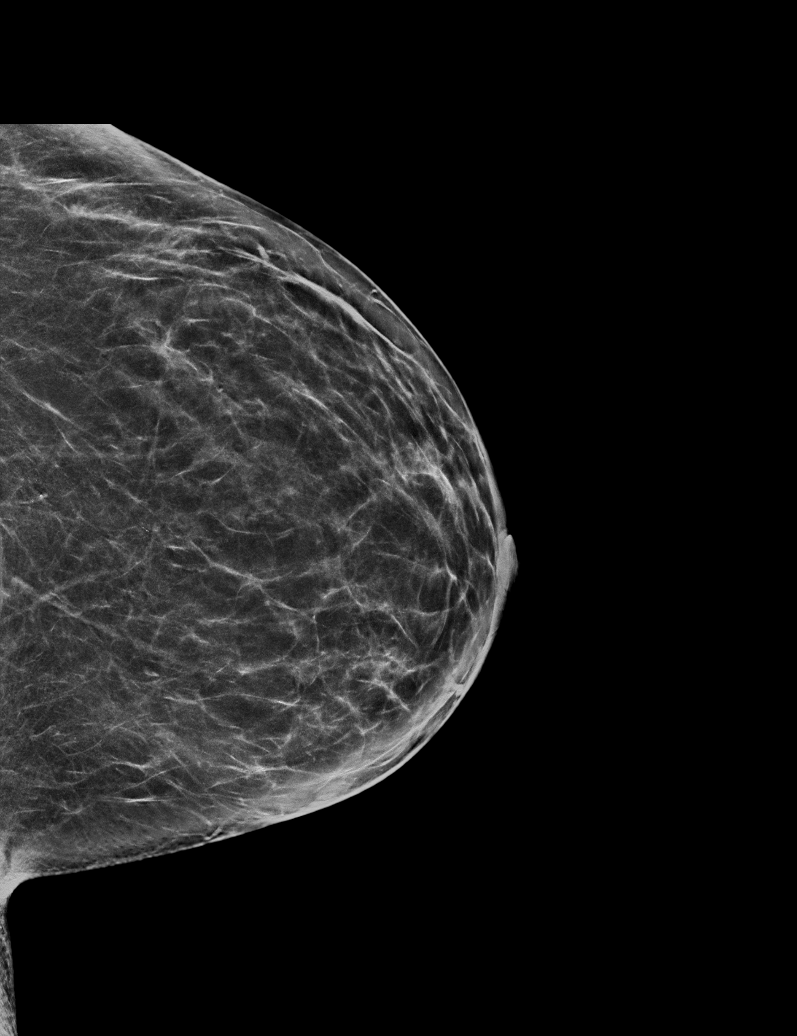

[R CC synth-2D (2 of 2)]
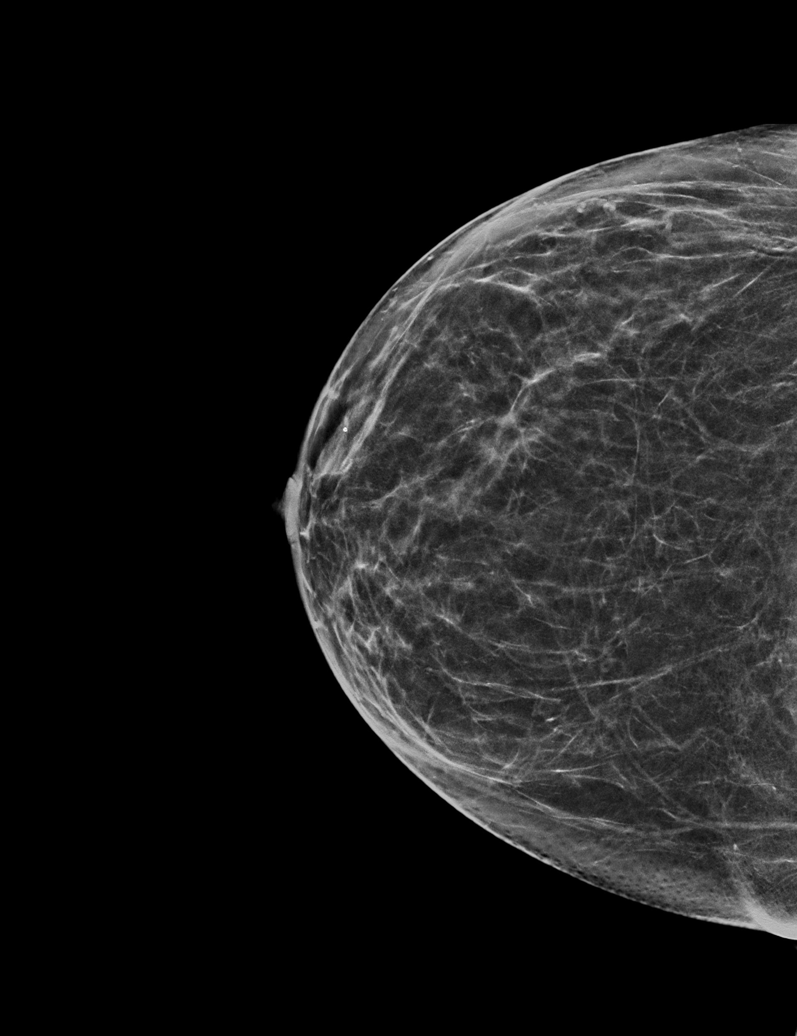

[R CC tomo · tomo slice 31/62.0]
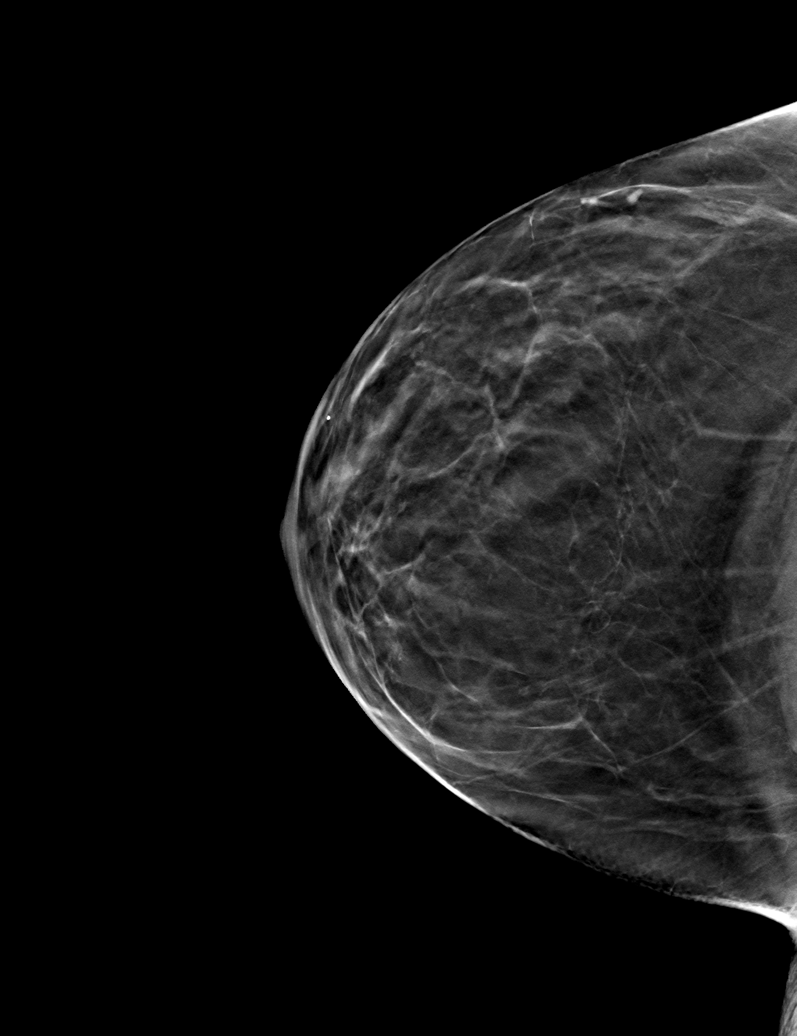

[6 of 30 positions shown; findings below may reference images not displayed]

ACR Breast Density Category b: There are scattered areas of
fibroglandular density.
FINDINGS: There are no findings suspicious for malignancy. The images were
evaluated with computer-aided detection.
IMPRESSION: No mammographic evidence of malignancy. A result letter of this
screening mammogram will be mailed directly to the patient.

RECOMMENDATION:
Screening mammogram in one year. (Code:[id number])

BI-RADS CATEGORY  1: Negative.

## 2022-01-11 ENCOUNTER — Other Ambulatory Visit: Payer: Self-pay | Admitting: Unknown Physician Specialty

## 2022-01-11 DIAGNOSIS — H9121 Sudden idiopathic hearing loss, right ear: Secondary | ICD-10-CM

## 2022-01-12 ENCOUNTER — Ambulatory Visit
Admission: RE | Admit: 2022-01-12 | Discharge: 2022-01-12 | Disposition: A | Discharge status: Home / Self Care | Payer: BC Managed Care – PPO | Source: Ambulatory Visit | Attending: Unknown Physician Specialty | Admitting: Unknown Physician Specialty

## 2022-01-12 DIAGNOSIS — H9121 Sudden idiopathic hearing loss, right ear: Secondary | ICD-10-CM | POA: Diagnosis present

## 2022-01-12 MED ORDER — GADOBUTROL 1 MMOL/ML IV SOLN
6.0000 mL | Freq: Once | INTRAVENOUS | Status: AC | PRN
  Administered 2022-01-12: 6 mL via INTRAVENOUS

## 2022-02-20 ENCOUNTER — Telehealth: Payer: Self-pay

## 2022-02-20 ENCOUNTER — Other Ambulatory Visit: Payer: Self-pay | Admitting: Obstetrics and Gynecology

## 2022-02-20 NOTE — Telephone Encounter (Signed)
Patient needs an appointment for further refills. Please call and schedule.

## 2022-02-20 NOTE — Telephone Encounter (Signed)
Pt is scheduled with Dr. Valentino Saxon on Jan. 24th.  Can she get the refills that she needs?  She is also wanting to switch her Rxs to the [facility] [address].  (Not the one inside [employer]).

## 2022-02-21 ENCOUNTER — Telehealth: Payer: Self-pay

## 2022-02-21 MED ORDER — PREMPRO 0.625-5 MG PO TABS: 1.0000 | ORAL_TABLET | Freq: Every day | ORAL | 0 refills | Status: DC

## 2022-02-21 NOTE — Addendum Note (Signed)
Addended by: Tommie Raymond on: 02/21/2022 02:28 PM   Modules accepted: Orders

## 2022-02-21 NOTE — Telephone Encounter (Signed)
Called patient to request name of medication that she is out of. LVM. If patient calls back send me a message.

## 2022-02-21 NOTE — Telephone Encounter (Signed)
Pt returned call; she needs prempro 60mg  and needs it sent to [facility] [address].

## 2022-02-28 ENCOUNTER — Ambulatory Visit: Payer: BC Managed Care – PPO | Admitting: Dermatology

## 2022-03-01 ENCOUNTER — Ambulatory Visit: Payer: BC Managed Care – PPO | Admitting: Dermatology

## 2022-03-06 ENCOUNTER — Ambulatory Visit: Payer: BC Managed Care – PPO | Admitting: Dermatology

## 2022-03-06 VITALS — BP 152/81 | HR 54

## 2022-03-06 DIAGNOSIS — D692 Other nonthrombocytopenic purpura: Secondary | ICD-10-CM

## 2022-03-06 DIAGNOSIS — L2081 Atopic neurodermatitis: Secondary | ICD-10-CM | POA: Diagnosis not present

## 2022-03-06 DIAGNOSIS — L649 Androgenic alopecia, unspecified: Secondary | ICD-10-CM

## 2022-03-06 MED ORDER — TRIAMCINOLONE ACETONIDE 0.1 % EX CREA: 1.0000 | TOPICAL_CREAM | CUTANEOUS | 1 refills | Status: DC

## 2022-03-06 MED ORDER — FINASTERIDE 5 MG PO TABS: 5.0000 mg | ORAL_TABLET | Freq: Every day | ORAL | 1 refills | Status: DC

## 2022-03-06 MED ORDER — TACROLIMUS 0.1 % EX OINT: TOPICAL_OINTMENT | CUTANEOUS | 3 refills | Status: AC

## 2022-03-06 MED ORDER — MINOXIDIL 2.5 MG PO TABS: 2.5000 mg | ORAL_TABLET | Freq: Every day | ORAL | 1 refills | Status: DC

## 2022-03-06 NOTE — Progress Notes (Signed)
New Patient Visit  Subjective  Caitlyn [name] is a 61 y.o. female who presents for the following: Eczema (Hands,arms, legs worsened over past year, itchy, has improved b/c she had been on Prednisone for her ear, has been off prednisone ~3wks, otc Cerave), Bleeding/Bruising (arms), and hair thinning (Scalp 5-10 yrs, pt says hair thin and will not grow). Father has female pattern baldness. She has h/o asthma.   The following portions of the chart were reviewed this encounter and updated as appropriate:       Review of Systems:  No other skin or systemic complaints except as noted in HPI or Assessment and Plan.  Objective  Well appearing patient in no apparent distress; mood and affect are within normal limits.  A focused examination was performed including scalp, hands, arms. Relevant physical exam findings are noted in the Assessment and Plan.  bil hands, legs, arms, back, face Pink scaly patches fingers, hand dorsum  Scalp Diffuse thinning of the crown and widening of the midline part with retention of the frontal hairline - Reviewed progressive nature and prognosis.             Assessment & Plan   Purpura - Chronic; persistent and recurrent.  Treatable, but not curable. - Violaceous macules and patches - Benign - Related to trauma, age, sun damage and/or use of blood thinners, chronic use of topical and/or oral steroids - Observe - Can use OTC arnica containing moisturizer such as Dermend Bruise Formula if desired - Call for worsening or other concerns   Atopic neurodermatitis bil hands, legs, arms, back, face  Chronic and persistent condition with duration or expected duration over one year. Condition is symptomatic/ bothersome to patient. Not currently at goal. 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  May consider Dupixent if she has recurrent flares.  Atopic dermatitis (eczema) is a chronic, relapsing,  pruritic condition that can significantly affect quality of life. It is often associated with allergic rhinitis and/or asthma and can require treatment with topical medications, phototherapy, or in severe cases biologic injectable medication (Dupixent; Adbry) or Oral JAK inhibitors.   Start Tacrolimus 0.1% oint qd/bid aa eczema face/body prn flares Start TMC 0.1% cr qd/bid up to 5d/wk prn flares, avoid f/g/a  Topical steroids (such as triamcinolone, fluocinolone, fluocinonide, mometasone, clobetasol, halobetasol, betamethasone, hydrocortisone) can cause thinning and lightening of the skin if they are used for too long in the same area. Your physician has selected the right strength medicine for your problem and area affected on the body. Please use your medication only as directed by your physician to prevent side effects.    tacrolimus (PROTOPIC) 0.1 % ointment - bil hands, legs, arms, back, face Apply topically as directed. Qd to bid aa eczema face, body prn flares  triamcinolone cream (KENALOG) 0.1 % - bil hands, legs, arms, back, face Apply 1 Application topically as directed. 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 Condition is bothersome/symptomatic for patient. Currently flared.   Pt will request recent labs from PCP, nl thyroid per pt  Female Androgenic Alopecia is a chronic condition related to genetics and/or hormonal changes.  In women androgenetic alopecia is commonly associated with menopause but may occur any time after puberty.  It causes hair thinning primarily on the crown with widening  of the part and temporal hairline recession.  Can use OTC Rogaine (minoxidil) 5% solution/foam as directed.  Oral treatments in female patients who have no contraindication may include : - Low dose oral minoxidil 1.25 - 5mg  daily -  Spironolactone 50 - 100mg  bid - Finasteride 2.5 - 5 mg daily Adjunctive therapies include: - Low Level Laser Light Therapy (LLLT) - Platelet-rich plasma injections (PRP) - Hair Transplants or scalp reduction    No hx of breast cancer BP 152/81 Pulse 54  Start Minoxidil 2.5mg  1/2 po qd  Start Finasteride 5mg  1/2 po qd  Doses of minoxidil for hair loss are considered 'low dose'. This is because the doses used for hair loss are a lot lower than the doses which are used for conditions such as high blood pressure (hypertension). The doses used for hypertension are 10-40mg  per day.  Side effects are uncommon at the low doses (up to 2.5 mg/day) used to treat hair loss. Potential side effects, more commonly seen at higher doses, include: Increase in hair growth (hypertrichosis) elsewhere on face and body Temporary hair shedding upon starting medication which may last up to 4 weeks Ankle swelling, fluid retention, rapid weight gain more than 5 pounds Low blood pressure and feeling lightheaded or dizzy when standing up quickly Fast or irregular heartbeat Headaches    minoxidil (LONITEN) 2.5 MG tablet - Scalp Take 1 tablet (2.5 mg total) by mouth daily.  finasteride (PROSCAR) 5 MG tablet - Scalp Take 1 tablet (5 mg total) by mouth daily.   Return in about 3 months (around 06/05/2022) for alopecia, atopic derm.  I, , RMA, am acting as scribe for , MD .  Documentation: I have reviewed the above documentation for accuracy and completeness, and I agree with the above.  06/07/2022 MD

## 2022-03-06 NOTE — Patient Instructions (Addendum)
For bruising on arms Can use Over The Counter arnica containing moisturizer such as Dermend Bruise Formula if desired  For hair thinning Start Minoxidil 2.5mg  1/2 pill once daily Start Finasteride 5mg  1/2 po once daily  Doses of minoxidil for hair loss are considered 'low dose'. This is because the doses used for hair loss are a lot lower than the doses which are used for conditions such as high blood pressure (hypertension). The doses used for hypertension are 10-40mg  per day.  Side effects are uncommon at the low doses (up to 2.5 mg/day) used to treat hair loss. Potential side effects, more commonly seen at higher doses, include: Increase in hair growth (hypertrichosis) elsewhere on face and body Temporary hair shedding upon starting medication which may last up to 4 weeks Ankle swelling, fluid retention, rapid weight gain more than 5 pounds Low blood pressure and feeling lightheaded or dizzy when standing up quickly Fast or irregular heartbeat Headaches    Due to recent changes in healthcare laws, you may see results of your pathology and/or laboratory studies on MyChart before the doctors have had a chance to review them. We understand that in some cases there may be results that are confusing or concerning to you. Please understand that not all results are received at the same time and often the doctors may need to interpret multiple results in order to provide you with the best plan of care or course of treatment. Therefore, we ask that you please give 2 business days to thoroughly review all your results before contacting the office for clarification. Should we see a critical lab result, you will be contacted sooner.   If You Need Anything After Your Visit  If you have any questions or concerns for your doctor, please call our main line at [phone] and press option 4 to reach your doctor's medical assistant. If no one answers, please leave a voicemail as directed and we will return  your call as soon as possible. Messages left after 4 pm will be answered the following business day.   You may also send [phone] a message via MyChart. We typically respond to MyChart messages within 1-2 business days.  For prescription refills, please ask your pharmacy to contact our office. Our fax number is [phone].  If you have an urgent issue when the clinic is closed that cannot wait until the next business day, you can page your doctor at the number below.    Please note that while we do our best to be available for urgent issues outside of office hours, we are not available 24/7.   If you have an urgent issue and are unable to reach [phone], you may choose to seek medical care at your doctor's office, retail clinic, urgent care center, or emergency room.  If you have a medical emergency, please immediately call 911 or go to the emergency department.  Pager Numbers  - Dr. [place]: [phone]  - Dr. [phone]: [phone]  - Dr. [phone]: [phone]  In the event of inclement weather, please call our main line at [phone] for an update on the status of any delays or closures.  Dermatology Medication Tips: Please keep the boxes that topical medications come in in order to help keep track of the instructions about where and how to use these. Pharmacies typically print the medication instructions only on the boxes and not directly on the medication tubes.   If your medication is too expensive, please contact our office at [phone]  option 4 or send [place] a message through MyChart.   We are unable to tell what your co-pay for medications will be in advance as this is different depending on your insurance coverage. However, we may be able to find a substitute medication at lower cost or fill out paperwork to get insurance to cover a needed medication.   If a prior authorization is required to get your medication covered by your insurance company, please allow [place] 1-2 business days to  complete this process.  Drug prices often vary depending on where the prescription is filled and some pharmacies may offer cheaper prices.  The website www.goodrx.com contains coupons for medications through different pharmacies. The prices here do not account for what the cost may be with help from insurance (it may be cheaper with your insurance), but the website can give you the price if you did not use any insurance.  - You can print the associated coupon and take it with your prescription to the pharmacy.  - You may also stop by our office during regular business hours and pick up a GoodRx coupon card.  - If you need your prescription sent electronically to a different pharmacy, notify our office through [facility] or by phone at [phone] option 4.     Si Usted Necesita Algo Despus de Su Visita  Tambin puede enviarnos un mensaje a travs de Clinical cytogeneticist. Por lo general respondemos a los mensajes de MyChart en el transcurso de 1 a 2 das hbiles.  Para renovar recetas, por favor pida a su farmacia que se ponga en contacto con nuestra oficina. Annie Sable de fax es [place] [phone].  Si tiene un asunto urgente cuando la clnica est cerrada y que no puede esperar hasta el siguiente da hbil, puede llamar/localizar a su doctor(a) al nmero que aparece a continuacin.   Por favor, tenga en cuenta que aunque hacemos todo lo posible para estar disponibles para asuntos urgentes fuera del horario de [place], no estamos disponibles las 24 horas del da, los 7 [address]  [address] de la [place].   Si tiene un problema urgente y no puede comunicarse con nosotros, puede optar por buscar atencin mdica  en el consultorio de su doctor(a), en una clnica privada, en un centro de atencin urgente o en una sala de emergencias.  Si tiene Engineer, drilling, por favor llame inmediatamente al 911 o vaya a la sala de emergencias.  Nmeros de bper  - Dr. Gwen Pounds: [phone]  - Dra. Moye:  [phone]  - Dra. Roseanne Reno: [phone]  En caso de inclemencias del [place], por favor llame a Lacy Duverney principal al [phone] para una actualizacin sobre el [place] de cualquier retraso o cierre.  Consejos para la medicacin en dermatologa: Por favor, guarde las cajas en las que vienen los medicamentos de uso tpico para ayudarle a seguir las instrucciones sobre dnde y cmo usarlos. Las farmacias generalmente imprimen las instrucciones del medicamento slo en las cajas y no directamente en los tubos del [place].   Si su medicamento es muy caro, por favor, pngase en contacto con Rolm Gala llamando al [phone] y presione la opcin 4 o envenos un mensaje a travs de Clinical cytogeneticist.   No podemos decirle cul ser su copago por los medicamentos por adelantado ya que esto es diferente dependiendo de la cobertura de su seguro. Sin embargo, es posible que podamos encontrar un medicamento sustituto a Audiological scientist un formulario para que el seguro cubra el medicamento que se considera necesario.  Si se requiere una autorizacin previa para que su compaa de seguros [place] su medicamento, por favor permtanos de 1 a 2 das hbiles para completar este proceso.  Los precios de los medicamentos varan con frecuencia dependiendo del Environmental consultant de dnde se surte la receta y alguna farmacias pueden ofrecer precios ms baratos.  El sitio web www.goodrx.com tiene cupones para medicamentos de Airline pilot. Los precios aqu no tienen en cuenta lo que podra costar con la ayuda del seguro (puede ser ms barato con su seguro), pero el sitio web puede darle el precio si no utiliz Research scientist (physical sciences).  - Puede imprimir el cupn correspondiente y llevarlo con su receta a la farmacia.  - Tambin puede pasar por nuestra oficina durante el horario de atencin regular y Charity fundraiser una tarjeta de cupones de GoodRx.  - Si necesita que su receta se enve electrnicamente a una farmacia diferente,  informe a nuestra oficina a travs de MyChart de [place] o por telfono llamando al [phone] y presione la opcin 4.

## 2022-03-29 ENCOUNTER — Other Ambulatory Visit: Payer: Self-pay | Admitting: Dermatology

## 2022-03-29 DIAGNOSIS — L649 Androgenic alopecia, unspecified: Secondary | ICD-10-CM

## 2022-04-12 ENCOUNTER — Encounter: Payer: Self-pay | Admitting: Obstetrics and Gynecology

## 2022-04-12 ENCOUNTER — Other Ambulatory Visit (HOSPITAL_COMMUNITY)
Admission: RE | Admit: 2022-04-12 | Discharge: 2022-04-12 | Disposition: A | Discharge status: Home / Self Care | Payer: BC Managed Care – PPO | Source: Ambulatory Visit | Attending: Obstetrics and Gynecology | Admitting: Obstetrics and Gynecology

## 2022-04-12 ENCOUNTER — Ambulatory Visit (INDEPENDENT_AMBULATORY_CARE_PROVIDER_SITE_OTHER): Payer: BC Managed Care – PPO | Admitting: Obstetrics and Gynecology

## 2022-04-12 VITALS — BP 124/68 | HR 63 | Resp 16 | Ht 61.75 in | Wt 134.7 lb

## 2022-04-12 DIAGNOSIS — Z124 Encounter for screening for malignant neoplasm of cervix: Secondary | ICD-10-CM | POA: Insufficient documentation

## 2022-04-12 DIAGNOSIS — Z01419 Encounter for gynecological examination (general) (routine) without abnormal findings: Secondary | ICD-10-CM | POA: Insufficient documentation

## 2022-04-12 DIAGNOSIS — Z79890 Hormone replacement therapy (postmenopausal): Secondary | ICD-10-CM

## 2022-04-12 DIAGNOSIS — N952 Postmenopausal atrophic vaginitis: Secondary | ICD-10-CM | POA: Diagnosis not present

## 2022-04-12 DIAGNOSIS — Z1231 Encounter for screening mammogram for malignant neoplasm of breast: Secondary | ICD-10-CM

## 2022-04-12 MED ORDER — PREMPRO 0.625-5 MG PO TABS: 1.0000 | ORAL_TABLET | Freq: Every day | ORAL | 11 refills | Status: DC

## 2022-04-12 NOTE — Progress Notes (Signed)
ANNUAL PREVENTATIVE CARE GYNECOLOGY  ENCOUNTER NOTE  Subjective:       Caitlyn [name] is a 62 y.o. [phone] female here for a routine annual gynecologic exam. She is sexually active. The patient is taking hormone replacement therapy (Prempro, has been on for several years for severe vasomotor symptoms). Patient denies post-menopausal vaginal bleeding. The patient wears seatbelts: yes.   Current complaints: 1. Reports some issues with vaginal dryness during intercourse.    Gynecologic History No LMP recorded. Patient is postmenopausal. Contraception: status post hysterectomy Last Pap: 03/12/2019. Results were: normal Last mammogram: 11/02/2021. Results were: normal Last Colonoscopy: 10/05/2020: 10 years Last Dexa Scan: Never done   Obstetric History OB History  Gravida Para Term Preterm AB Living  5 2 2   3 2   SAB IAB Ectopic Multiple Live Births  3       2    # Outcome Date GA Lbr Len/2nd Weight Sex Delivery Anes PTL Lv  5 Term 1990   7 lb 1.8 oz (3.225 kg) F CS-LTranv   LIV  4 Term 1984   7 lb 1.8 oz (3.225 kg) M CS-LTranv   LIV  3 SAB           2 SAB           1 SAB             Past Medical History:  Diagnosis Date   Anxiety    Arthritis    Asthma    Constipation    Cystocele    GERD (gastroesophageal reflux disease)    Hypertension    Increased BMI    Menopause    Menorrhagia    Seasonal allergies    Stress incontinence     Family History  Problem Relation Age of Onset   Breast cancer Maternal Aunt 35   Colon cancer Paternal Grandfather    Diabetes Paternal Grandfather    Ovarian cancer Neg Hx    Heart disease Neg Hx     Past Surgical History:  Procedure Laterality Date   ABLATION     CARPAL TUNNEL RELEASE     CESAREAN SECTION     x 2   DILATION AND CURETTAGE OF UTERUS     HYSTEROSCOPY     LAPAROSCOPIC GASTRIC SLEEVE RESECTION      Social History   Socioeconomic History   Marital status: Widowed    Spouse name: Not on file   Number of  children: Not on file   Years of education: Not on file   Highest education level: Not on file  Occupational History   Not on file  Tobacco Use   Smoking status: Never   Smokeless tobacco: Never  Vaping Use   Vaping Use: Never used  Substance and Sexual Activity   Alcohol use: Yes    Comment: 2 month   Drug use: No   Sexual activity: Not Currently    Birth control/protection: Post-menopausal  Other Topics Concern   Not on file  Social History Narrative   Not on file   Social Determinants of Health   Financial Resource Strain: Not on file  Food Insecurity: Not on file  Transportation Needs: Not on file  Physical Activity: Not on file  Stress: Not on file  Social Connections: Not on file  Intimate Partner Violence: Not on file    Current Outpatient Medications on File Prior to Visit  Medication Sig Dispense Refill   albuterol (PROVENTIL HFA;VENTOLIN HFA) 108 (90  Base) MCG/ACT inhaler Inhale into the lungs every 6 (six) hours as needed for wheezing or shortness of breath.     atomoxetine (STRATTERA) 80 MG capsule atomoxetine 80 mg capsule (Patient not taking: Reported on 03/06/2022)     budesonide-formoterol (SYMBICORT) 160-4.5 MCG/ACT inhaler Inhale 2 puffs into the lungs 2 (two) times daily.     calcium-vitamin D (OSCAL WITH D) 250-125 MG-UNIT tablet Take 1 tablet by mouth daily.     celecoxib (CELEBREX) 100 MG capsule Take 100 mg by mouth 2 (two) times daily.     cetirizine (ZYRTEC) 10 MG tablet Take 10 mg by mouth daily.     estrogen, conjugated,-medroxyprogesterone (PREMPRO) 0.625-5 MG tablet Take 1 tablet by mouth daily. 30 tablet 0   finasteride (PROSCAR) 5 MG tablet TAKE 1 TABLET (5 MG TOTAL) BY MOUTH DAILY. 90 tablet 1   fluticasone (FLONASE) 50 MCG/ACT nasal spray Place into both nostrils daily.     hydrALAZINE (APRESOLINE) 25 MG tablet Take 25 mg by mouth 3 (three) times daily.     hydrochlorothiazide (HYDRODIURIL) 25 MG tablet Take 25 mg by mouth daily.      irbesartan (AVAPRO) 300 MG tablet Take by mouth daily.     labetalol (NORMODYNE) 100 MG tablet Take 100 mg by mouth 2 (two) times daily.     minoxidil (LONITEN) 2.5 MG tablet TAKE 1 TABLET BY MOUTH EVERY DAY 90 tablet 1   montelukast (SINGULAIR) 10 MG tablet Take 10 mg by mouth at bedtime.     tacrolimus (PROTOPIC) 0.1 % ointment Apply topically as directed. Qd to bid aa eczema face, body prn flares 100 g 3   triamcinolone cream (KENALOG) 0.1 % Apply 1 Application topically as directed. Qd to bid up to 5 days per week to eczema flares until clear, avoid face, groin, axilla 80 g 1   No current facility-administered medications on file prior to visit.    No Known Allergies    Review of Systems ROS Review of Systems - General ROS: negative for - chills, fatigue, fever, hot flashes, night sweats, weight gain or weight loss Psychological ROS: negative for - anxiety, decreased libido, depression, mood swings, physical abuse or sexual abuse Ophthalmic ROS: negative for - blurry vision, eye pain or loss of vision ENT ROS: negative for - headaches, hearing change, visual changes or vocal changes Allergy and Immunology ROS: negative for - hives, itchy/watery eyes or seasonal allergies Hematological and Lymphatic ROS: negative for - bleeding problems, bruising, swollen lymph nodes or weight loss Endocrine ROS: negative for - galactorrhea, hair pattern changes, hot flashes, malaise/lethargy, mood swings, palpitations, polydipsia/polyuria, skin changes, temperature intolerance or unexpected weight changes Breast ROS: negative for - new or changing breast lumps or nipple discharge Respiratory ROS: negative for - cough or shortness of breath Cardiovascular ROS: negative for - chest pain, irregular heartbeat, palpitations or shortness of breath Gastrointestinal ROS: no abdominal pain, change in bowel habits, or black or bloody stools Genito-Urinary ROS: no dysuria, trouble voiding, or hematuria. Positive  - Vaginal dryness Musculoskeletal ROS: negative for - joint pain or joint stiffness Neurological ROS: negative for - bowel and bladder control changes Dermatological ROS: negative for rash and skin lesion changes   Objective:   BP 124/68   Pulse 63   Resp 16   Ht 5' 1.75" (1.568 m)   Wt 134 lb 11.2 oz (61.1 kg)   BMI 24.84 kg/m  CONSTITUTIONAL: Well-developed, well-nourished female in no acute distress.  PSYCHIATRIC: Normal mood and affect.  Normal behavior. Normal judgment and thought content. [place]: Alert and oriented to person, place, and time. Normal muscle tone coordination. No cranial nerve deficit noted. HENT:  Normocephalic, atraumatic, External right and left ear normal. Oropharynx is clear and moist EYES: Conjunctivae and EOM are normal. Pupils are equal, round, and reactive to light. No scleral icterus.  NECK: Normal range of motion, supple, no masses.  Normal thyroid.  SKIN: Skin is warm and dry. No rash noted. Not diaphoretic. No erythema. No pallor. CARDIOVASCULAR: Normal heart rate noted, regular rhythm, no murmur. RESPIRATORY: Clear to auscultation bilaterally. Effort and breath sounds normal, no problems with respiration noted. BREASTS: Symmetric in size. No masses, skin changes, nipple drainage, or lymphadenopathy. ABDOMEN: Soft, normal bowel sounds, no distention noted.  No tenderness, rebound or guarding.  BLADDER: Normal PELVIC:  Bladder no bladder distension noted  Urethra: normal appearing urethra with no masses, tenderness or lesions  Vulva: normal appearing vulva with no masses, tenderness or lesions  Vagina: mild vaginal atrophy, no discharge, no lesions  Cervix: normal appearing cervix without discharge or lesions  Uterus: uterus is normal size, shape, consistency and nontender  Adnexa: normal adnexa in size, nontender and no masses  RV: External Exam NormaI, No Rectal Masses, and Normal Sphincter tone  MUSCULOSKELETAL: Normal range of motion. No  tenderness.  No cyanosis, clubbing, or edema.  2+ distal pulses. LYMPHATIC: No Axillary, Supraclavicular, or Inguinal Adenopathy.   Labs: Performed by PCP   Assessment:   1. Well woman exam with routine gynecological exam   2. Cervical cancer screening   3. Encounter for screening mammogram for malignant neoplasm of breast   4. Vaginal atrophy   5. Post-menopause on HRT (hormone replacement therapy)      Plan:  - Pap: Pap Co Test - Mammogram: Ordered - Colon Screening:   Up to date - Labs:  performed by PCP - Routine preventative health maintenance measures emphasized: Exercise/Diet/Weight control, Tobacco Warnings, Alcohol/Substance use risks, Stress Management, Peer Pressure Issues, and Safe Sex - COVID Vaccination status: up to date.  - Discussed options for vaginal atrophy and painful intercourse, including use of vaginal lubricants vs local estrogen therapy. Patient ok to try lubricants first, given samples of UberLube. If no improvement, can consider local estrogen or testosterone therapy.  - Post-me[place], on HRT. Doing well on current dosing. Can continue, advised on lowest dose for shortest duration of time necessary. Refill given.  - Return to [place], MD [place]

## 2022-04-15 ENCOUNTER — Encounter: Payer: Self-pay | Admitting: Obstetrics and Gynecology

## 2022-04-18 LAB — CYTOLOGY - PAP
Comment: NEGATIVE
Diagnosis: NEGATIVE
High risk HPV: NEGATIVE

## 2022-06-06 ENCOUNTER — Ambulatory Visit: Payer: BC Managed Care – PPO | Admitting: Dermatology

## 2022-06-06 VITALS — BP 123/67

## 2022-06-06 DIAGNOSIS — L649 Androgenic alopecia, unspecified: Secondary | ICD-10-CM | POA: Diagnosis not present

## 2022-06-06 DIAGNOSIS — L2081 Atopic neurodermatitis: Secondary | ICD-10-CM

## 2022-06-06 MED ORDER — DUPIXENT 300 MG/2ML ~~LOC~~ SOAJ: 300.0000 mg | SUBCUTANEOUS | 5 refills | Status: DC

## 2022-06-06 MED ORDER — DUPILUMAB 300 MG/2ML ~~LOC~~ SOSY
600.0000 mg | PREFILLED_SYRINGE | Freq: Once | SUBCUTANEOUS | Status: AC
  Administered 2022-06-06: 600 mg via SUBCUTANEOUS

## 2022-06-06 NOTE — Patient Instructions (Addendum)
Dupilumab (Dupixent) is a treatment given by injection for adults and children with moderate-to-severe atopic dermatitis. Goal is control of skin condition, not cure. It is given as 2 injections at the first dose followed by 1 injection ever 2 weeks thereafter.  Young children are dosed monthly.  Potential side effects include allergic reaction, herpes infections, injection site reactions and conjunctivitis (inflammation of the eyes).  The use of Dupixent requires long term medication management, including periodic office visits.     Due to recent changes in healthcare laws, you may see results of your pathology and/or laboratory studies on MyChart before the doctors have had a chance to review them. We understand that in some cases there may be results that are confusing or concerning to you. Please understand that not all results are received at the same time and often the doctors may need to interpret multiple results in order to provide you with the best plan of care or course of treatment. Therefore, we ask that you please give us 2 business days to thoroughly review all your results before contacting the office for clarification. Should we see a critical lab result, you will be contacted sooner.   If You Need Anything After Your Visit  If you have any questions or concerns for your doctor, please call our main line at [phone] and press option 4 to reach your doctor's medical assistant. If no one answers, please leave a voicemail as directed and we will return your call as soon as possible. Messages left after 4 pm will be answered the following business day.   You may also send us a message via MyChart. We typically respond to MyChart messages within 1-2 business days.  For prescription refills, please ask your pharmacy to contact our office. Our fax number is [phone].  If you have an urgent issue when the clinic is closed that cannot wait until the next business day, you can page  your doctor at the number below.    Please note that while we do our best to be available for urgent issues outside of office hours, we are not available 24/7.   If you have an urgent issue and are unable to reach us, you may choose to seek medical care at your doctor's office, retail clinic, urgent care center, or emergency room.  If you have a medical emergency, please immediately call 911 or go to the emergency department.  Pager Numbers  - Dr. Kowalski: [phone]  - Dr. Moye: [phone]  - Dr. Stewart: [phone]  In the event of inclement weather, please call our main line at [phone] for an update on the status of any delays or closures.  Dermatology Medication Tips: Please keep the boxes that topical medications come in in order to help keep track of the instructions about where and how to use these. Pharmacies typically print the medication instructions only on the boxes and not directly on the medication tubes.   If your medication is too expensive, please contact our office at [phone] option 4 or send us a message through MyChart.   We are unable to tell what your co-pay for medications will be in advance as this is different depending on your insurance coverage. However, we may be able to find a substitute medication at lower cost or fill out paperwork to get insurance to cover a needed medication.   If a prior authorization is required to get your medication covered by your insurance company, please allow us 1-2 business days   to complete this process.  Drug prices often vary depending on where the prescription is filled and some pharmacies may offer cheaper prices.  The website www.goodrx.com contains coupons for medications through different pharmacies. The prices here do not account for what the cost may be with help from insurance (it may be cheaper with your insurance), but the website can give you the price if you did not use any insurance.  - You can  print the associated coupon and take it with your prescription to the pharmacy.  - You may also stop by our office during regular business hours and pick up a GoodRx coupon card.  - If you need your prescription sent electronically to a different pharmacy, notify our office through  MyChart or by phone at [phone] option 4.     Si Usted Necesita Algo Despus de Su Visita  Tambin puede enviarnos un mensaje a travs de MyChart. Por lo general respondemos a los mensajes de MyChart en el transcurso de 1 a 2 das hbiles.  Para renovar recetas, por favor pida a su farmacia que se ponga en contacto con nuestra oficina. Nuestro nmero de fax es el [phone].  Si tiene un asunto urgente cuando la clnica est cerrada y que no puede esperar hasta el siguiente da hbil, puede llamar/localizar a su doctor(a) al nmero que aparece a continuacin.   Por favor, tenga en cuenta que aunque hacemos todo lo posible para estar disponibles para asuntos urgentes fuera del horario de oficina, no estamos disponibles las 24 horas del da, los 7 das de la semana.   Si tiene un problema urgente y no puede comunicarse con nosotros, puede optar por buscar atencin mdica  en el consultorio de su doctor(a), en una clnica privada, en un centro de atencin urgente o en una sala de emergencias.  Si tiene una emergencia mdica, por favor llame inmediatamente al 911 o vaya a la sala de emergencias.  Nmeros de bper  - Dr. Kowalski: [phone]  - Dra. Moye: [phone]  - Dra. Stewart: [phone]  En caso de inclemencias del tiempo, por favor llame a nuestra lnea principal al [phone] para una actualizacin sobre el estado de cualquier retraso o cierre.  Consejos para la medicacin en dermatologa: Por favor, guarde las cajas en las que vienen los medicamentos de uso tpico para ayudarle a seguir las instrucciones sobre dnde y cmo usarlos. Las farmacias generalmente imprimen las  instrucciones del medicamento slo en las cajas y no directamente en los tubos del medicamento.   Si su medicamento es muy caro, por favor, pngase en contacto con nuestra oficina llamando al [phone] y presione la opcin 4 o envenos un mensaje a travs de MyChart.   No podemos decirle cul ser su copago por los medicamentos por adelantado ya que esto es diferente dependiendo de la cobertura de su seguro. Sin embargo, es posible que podamos encontrar un medicamento sustituto a menor costo o llenar un formulario para que el seguro cubra el medicamento que se considera necesario.   Si se requiere una autorizacin previa para que su compaa de seguros cubra su medicamento, por favor permtanos de 1 a 2 das hbiles para completar este proceso.  Los precios de los medicamentos varan con frecuencia dependiendo del lugar de dnde se surte la receta y alguna farmacias pueden ofrecer precios ms baratos.  El sitio web www.goodrx.com tiene cupones para medicamentos de diferentes farmacias. Los precios aqu no tienen en cuenta lo que podra costar con la ayuda   del seguro (puede ser ms barato con su seguro), pero el sitio web puede darle el precio si no utiliz ningn seguro.  - Puede imprimir el cupn correspondiente y llevarlo con su receta a la farmacia.  - Tambin puede pasar por nuestra oficina durante el horario de atencin regular y recoger una tarjeta de cupones de GoodRx.  - Si necesita que su receta se enve electrnicamente a una farmacia diferente, informe a nuestra oficina a travs de MyChart de  o por telfono llamando al [phone] y presione la opcin 4.  

## 2022-06-06 NOTE — Progress Notes (Signed)
Follow-Up Visit   Subjective  Caitlyn [name] is a 62 y.o. female who presents for the following: Follow-up.  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 She is improved some, but still having flares and itching. She is using tacrolimus ointment and TMC Cream. She has seasonal allergies and asthma.  Also follow-up for androgenetic alopecia. She thinks she has noticed decreased shedding. She is taking finasteride 1/2 tablet and minoxidil 1/2 tablet daily.   The following portions of the chart were reviewed this encounter and updated as appropriate:       Review of Systems:  No other skin or systemic complaints except as noted in HPI or Assessment and Plan.  Objective  Well appearing patient in no apparent distress; mood and affect are within normal limits.  A focused examination was performed including face, scalp, trunk. Relevant physical exam findings are noted in the Assessment and Plan.  hands, arms, legs, back Light pink scaly patches bilateral hands, thenar, MCPs; scaling of the fingertips; xerosis with mild erythema of the back. Itching all over, never stops 10% BSA  Scalp Diffuse thinning of the crown and decreased widening of the midline part with retention of the frontal hairline - Reviewed progressive nature and prognosis. Some improvement when compared to baseline photos with narrowing of midline part    Assessment & Plan  Atopic neurodermatitis hands, arms, legs, back  Chronic and persistent condition with duration or expected duration over one year. Condition is bothersome/symptomatic for patient. Currently flared.   Atopic dermatitis (eczema) is a chronic, relapsing, pruritic condition that can significantly affect quality of life. It is often associated with allergic rhinitis and/or asthma and can require treatment with topical medications, phototherapy, or in severe cases biologic injectable medication (Dupixent; Adbry) or Oral JAK  inhibitors.  Continue tacrolimus 0.1% ointment QD/BID Continue TMC 0.1% Cream spot treat more severe areas QD/BID.  Discussed Dupixent injections and side effects.  Dupilumab (Dupixent) is a treatment given by injection for adults and children with moderate-to-severe atopic dermatitis. Goal is control of skin condition, not cure. It is given as 2 injections at the first dose followed by 1 injection ever 2 weeks thereafter.  Young children are dosed monthly.  Potential side effects include allergic reaction, herpes infections, injection site reactions and conjunctivitis (inflammation of the eyes).  The use of Dupixent requires long term medication management, including periodic office visits.  Pending insurance approval, plan to start Dupixent injections.   Loading dose given to patient in office today. Dupixent 300 MG/2ML x 2 injected into left and right upper arms. Patient tolerated well. Lot [id number]  Exp 06/2024 [place] [id number]  Patient will return in 2 weeks for next Dupixent injection (sample).  Dupilumab (DUPIXENT) 300 MG/2ML SOPN - hands, arms, legs, back Inject 300 mg into the skin every 14 (fourteen) days. Starting at day 15 for maintenance.  dupilumab (DUPIXENT) prefilled syringe 600 mg - hands, arms, legs, back   Related Medications tacrolimus (PROTOPIC) 0.1 % ointment Apply topically as directed. Qd to bid aa eczema face, body prn flares  triamcinolone cream (KENALOG) 0.1 % Apply 1 Application topically as directed. 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 Condition is symptomatic / bothersome to patient. Improving but not to goal.  Female Androgenic Alopecia is a chronic condition related to genetics and/or hormonal  changes.  In women androgenetic alopecia is commonly associated with menopause but may occur any  time after puberty.  It causes hair thinning primarily on the crown with widening of the part and temporal hairline recession.  Can use OTC Rogaine (minoxidil) 5% solution/foam as directed.  Oral treatments in female patients who have no contraindication may include : - Low dose oral minoxidil 1.25 - 5mg  daily - Spironolactone 50 - 100mg  bid - Finasteride 2.5 - 5 mg daily Adjunctive therapies include: - Low Level Laser Light Therapy (LLLT) - Platelet-rich plasma injections (PRP) - Hair Transplants or scalp reduction  BP 123/67  Continue Minoxidil 2.5mg  1/2 po qd, may increase to 1 tab daily as tolerated  Continue Finasteride 5mg  1/2 po qd, may increase to 1 tab daily  Doses of minoxidil for hair loss are considered 'low dose'. This is because the doses used for hair loss are much lower than the doses which are used for conditions such as high blood pressure (hypertension). The doses used for hypertension are 10-40mg  per day.  Side effects are uncommon at the low doses (up to 2.5 mg/day) used to treat hair loss. Potential side effects, more commonly seen at higher doses, include: Increase in hair growth (hypertrichosis) elsewhere on face and body Temporary hair shedding upon starting medication which may last up to 4 weeks Ankle swelling, fluid retention, rapid weight gain more than 5 pounds Low blood pressure and feeling lightheaded or dizzy when standing up quickly Fast or irregular heartbeat Headaches   Related Medications finasteride (PROSCAR) 5 MG tablet TAKE 1 TABLET (5 MG TOTAL) BY MOUTH DAILY.  minoxidil (LONITEN) 2.5 MG tablet TAKE 1 TABLET BY MOUTH EVERY DAY   Return in about 1 month (around 07/07/2022) for dermatitis/dupixent, 2 weeks nurse visit for Dupixent injection (sample).  IJamesetta Orleans, CMA, am acting as scribe for Brendolyn Patty, MD .  Documentation: I have reviewed the above documentation for accuracy and completeness, and I agree with the above.  Brendolyn Patty MD

## 2022-06-20 ENCOUNTER — Ambulatory Visit (INDEPENDENT_AMBULATORY_CARE_PROVIDER_SITE_OTHER): Payer: BC Managed Care – PPO

## 2022-06-20 DIAGNOSIS — L209 Atopic dermatitis, unspecified: Secondary | ICD-10-CM | POA: Diagnosis not present

## 2022-06-20 MED ORDER — DUPILUMAB 300 MG/2ML ~~LOC~~ SOSY
300.0000 mg | PREFILLED_SYRINGE | Freq: Once | SUBCUTANEOUS | Status: AC
  Administered 2022-06-20: 300 mg via SUBCUTANEOUS

## 2022-06-20 NOTE — Progress Notes (Signed)
Patient here today for 2 week Dupixent injection for Severe Atopic Dermatitis.   Dupixent 300mg  injected today into the right  upper arm. Patient tolerated procedure well.    Lot: [id number] Exp:12/2022 [facility]DC: [id number]   Johnsie Kindred, RMA

## 2022-07-03 ENCOUNTER — Other Ambulatory Visit: Payer: Self-pay | Admitting: Dermatology

## 2022-07-03 DIAGNOSIS — L649 Androgenic alopecia, unspecified: Secondary | ICD-10-CM

## 2022-07-11 ENCOUNTER — Ambulatory Visit: Payer: BC Managed Care – PPO | Admitting: Dermatology

## 2022-07-11 VITALS — BP 146/89 | HR 79

## 2022-07-11 DIAGNOSIS — L2081 Atopic neurodermatitis: Secondary | ICD-10-CM

## 2022-07-11 DIAGNOSIS — Z79899 Other long term (current) drug therapy: Secondary | ICD-10-CM

## 2022-07-11 DIAGNOSIS — D692 Other nonthrombocytopenic purpura: Secondary | ICD-10-CM | POA: Diagnosis not present

## 2022-07-11 NOTE — Progress Notes (Signed)
   Follow-Up Visit   Subjective  Caitlyn [name] is a 62 y.o. female who presents for the following: 6 weeks  f/u on atopic dermatitis on her body treating with Dupixent injection once every 2 weeks with a great response, using tacrolimus 0.1% ointment QD/BID and triamcinolone  0.1% Cream spot treat more severe areas QD/BID,  skin is no longer itching, patient is very pleased with Dupixent injections, she report Dupixent is a life changing medication.    The following portions of the chart were reviewed this encounter and updated as appropriate: medications, allergies, medical history  Review of Systems:  No other skin or systemic complaints except as noted in HPI or Assessment and Plan.  Objective  Well appearing patient in no apparent distress; mood and affect are within normal limits.  Areas Examined:hands,arms,legs,back    Relevant physical exam findings are noted in the Assessment and Plan.   Assessment & Plan   ATOPIC DERMATITIS Exam: erythema with scale on the right 3rd finger tip, right 4th MCP , clear on arms and back, mild erythema on the R lateral ankle  Chronic condition with duration or expected duration over one year. Currently well-controlled on Dupixent.  Patient is very pleased with treatment.  Atopic dermatitis - Severe, on Dupixent (biologic medication).  Atopic dermatitis (eczema) is a chronic, relapsing, pruritic condition that can significantly affect quality of life. It is often associated with allergic rhinitis and/or asthma and can require treatment with topical medications, phototherapy, or in severe cases biologic medications, which require long term medication management.    Treatment Plan:  Continue Dupixent injections once every 2 weeks- pt self-injects  Dupilumab (Dupixent) is a treatment given by injection for adults and children with moderate-to-severe atopic dermatitis. Goal is control of skin condition, not cure. It is given as 2 injections at the  first dose followed by 1 injection ever 2 weeks thereafter.  Young children are dosed monthly.  Potential side effects include allergic reaction, herpes infections, injection site reactions and conjunctivitis (inflammation of the eyes).  The use of Dupixent requires long term medication management, including periodic office visits.   Recommend mild soap and moisturizing cream 1-2 times daily.  Continue gentle skin care.    Purpura - Chronic; persistent and recurrent.  Treatable, but not curable. - Violaceous macules and patches - Benign - Related to trauma, age, sun damage and/or use of blood thinners, chronic use of topical and/or oral steroids - Observe - Can use OTC arnica containing moisturizer such as Dermend Bruise Formula if desired - Call for worsening or other concerns   Return in about 6 months (around 01/10/2023) for Atopic dermatitis .  I, Angelique Holm, CMA, am acting as scribe for Willeen Niece, MD .   Documentation: I have reviewed the above documentation for accuracy and completeness, and I agree with the above.  Willeen Niece, MD

## 2022-07-11 NOTE — Patient Instructions (Addendum)
Gentle Skin Care Guide  1. Bathe no more than once a day.  2. Avoid bathing in hot water  3. Use a mild soap like Dove, Vanicream, Cetaphil, CeraVe. Can use Lever 2000 or Cetaphil antibacterial soap  4. Use soap only where you need it. On most days, use it under your arms, between your legs, and on your feet. Let the water rinse other areas unless visibly dirty.  5. When you get out of the bath/shower, use a towel to gently blot your skin dry, don't rub it.  6. While your skin is still a little damp, apply a moisturizing cream such as Vanicream, CeraVe, Cetaphil, Eucerin, Sarna lotion or plain Vaseline Jelly. For hands apply Neutrogena [place] Hand Cream or Excipial Hand Cream.  7. Reapply moisturizer any time you start to itch or feel dry.  8. Sometimes using free and clear laundry detergents can be helpful. Fabric softener sheets should be avoided. Downy Free & Gentle liquid, or any liquid fabric softener that is free of dyes and perfumes, it acceptable to use  9. If your doctor has given you prescription creams you may apply moisturizers over them    Purpura - Arms- Chronic; persistent and recurrent.  Treatable, but not curable. - Related to trauma, age, sun damage and/or use of blood thinners, chronic use of topical and/or oral steroids - Observe - Can use OTC arnica containing moisturizer such as Dermend Bruise Formula if desired - Call for worsening or other concerns     Due to recent changes in healthcare laws, you may see results of your pathology and/or laboratory studies on MyChart before the doctors have had a chance to review them. We understand that in some cases there may be results that are confusing or concerning to you. Please understand that not all results are received at the same time and often the doctors may need to interpret multiple results in order to provide you with the best plan of care or course of treatment. Therefore, we ask that you please give [place] 2  business days to thoroughly review all your results before contacting the office for clarification. Should we see a critical lab result, you will be contacted sooner.   If You Need Anything After Your Visit  If you have any questions or concerns for your doctor, please call our main line at [phone] and press option 4 to reach your doctor's medical assistant. If no one answers, please leave a voicemail as directed and we will return your call as soon as possible. Messages left after 4 pm will be answered the following business day.   You may also send [place] a message via MyChart. We typically respond to MyChart messages within 1-2 business days.  For prescription refills, please ask your pharmacy to contact our office. Our fax number is [phone].  If you have an urgent issue when the clinic is closed that cannot wait until the next business day, you can page your doctor at the number below.    Please note that while we do our best to be available for urgent issues outside of office hours, we are not available 24/7.   If you have an urgent issue and are unable to reach [place], you may choose to seek medical care at your doctor's office, retail clinic, urgent care center, or emergency room.  If you have a medical emergency, please immediately call 911 or go to the emergency department.  Pager Numbers  - Dr. Gwen Pounds: [phone]  -  Dr. Neale Burly: [phone]  - Dr. Roseanne Reno: [phone]  In the event of inclement weather, please call our main line at [phone] for an update on the status of any delays or closures.  Dermatology Medication Tips: Please keep the boxes that topical medications come in in order to help keep track of the instructions about where and how to use these. Pharmacies typically print the medication instructions only on the boxes and not directly on the medication tubes.   If your medication is too expensive, please contact our office at [phone] option 4 or  send [place] a message through MyChart.   We are unable to tell what your co-pay for medications will be in advance as this is different depending on your insurance coverage. However, we may be able to find a substitute medication at lower cost or fill out paperwork to get insurance to cover a needed medication.   If a prior authorization is required to get your medication covered by your insurance company, please allow [place] 1-2 business days to complete this process.  Drug prices often vary depending on where the prescription is filled and some pharmacies may offer cheaper prices.  The website www.goodrx.com contains coupons for medications through different pharmacies. The prices here do not account for what the cost may be with help from insurance (it may be cheaper with your insurance), but the website can give you the price if you did not use any insurance.  - You can print the associated coupon and take it with your prescription to the pharmacy.  - You may also stop by our office during regular business hours and pick up a GoodRx coupon card.  - If you need your prescription sent electronically to a different pharmacy, notify our office through [facility] or by phone at [phone] option 4.     Si Usted Necesita Algo Despus de Su Visita  Tambin puede enviarnos un mensaje a travs de Clinical cytogeneticist. Por lo general respondemos a los mensajes de MyChart en el transcurso de 1 a 2 das hbiles.  Para renovar recetas, por favor pida a su farmacia que se ponga en contacto con nuestra oficina. Annie Sable de fax es [place] [phone].  Si tiene un asunto urgente cuando la clnica est cerrada y que no puede esperar hasta el siguiente da hbil, puede llamar/localizar a su doctor(a) al nmero que aparece a continuacin.   Por favor, tenga en cuenta que aunque hacemos todo lo posible para estar disponibles para asuntos urgentes fuera del horario de [place], no estamos disponibles las 24 horas del  da, los 7 [address]  [address] de la [place].   Si tiene un problema urgente y no puede comunicarse con nosotros, puede optar por buscar atencin mdica  en el consultorio de su doctor(a), en una clnica privada, en un centro de atencin urgente o en una sala de emergencias.  Si tiene Engineer, drilling, por favor llame inmediatamente al 911 o vaya a la sala de emergencias.  Nmeros de bper  - Dr. Gwen Pounds: [phone]  - Dra. Moye: [phone]  - Dra. Roseanne Reno: [phone]  En caso de inclemencias del [place], por favor llame a Lacy Duverney principal al [phone] para una actualizacin sobre el [place] de cualquier retraso o cierre.  Consejos para la medicacin en dermatologa: Por favor, guarde las cajas en las que vienen los medicamentos de uso tpico para ayudarle a seguir las instrucciones sobre dnde y cmo usarlos. Las farmacias generalmente imprimen las instrucciones del medicamento slo en las cajas y  no directamente en los tubos del medicamento.   Si su medicamento es muy caro, por favor, pngase en contacto con Rolm Gala llamando al [phone] y presione la opcin 4 o envenos un mensaje a travs de Clinical cytogeneticist.   No podemos decirle cul ser su copago por los medicamentos por adelantado ya que esto es diferente dependiendo de la cobertura de su seguro. Sin embargo, es posible que podamos encontrar un medicamento sustituto a Audiological scientist un formulario para que el seguro cubra el medicamento que se considera necesario.   Si se requiere una autorizacin previa para que su compaa de seguros [place] su medicamento, por favor permtanos de 1 a 2 das hbiles para completar [address]  Los precios de los medicamentos varan con frecuencia dependiendo del Environmental consultant de dnde se surte la receta y alguna farmacias pueden ofrecer precios ms baratos.  El sitio web www.goodrx.com tiene cupones para medicamentos de Health and safety inspector. Los precios aqu no tienen en cuenta lo que podra  costar con la ayuda del seguro (puede ser ms barato con su seguro), pero el sitio web puede darle el precio si no utiliz Tourist information centre manager.  - Puede imprimir el cupn correspondiente y llevarlo con su receta a la farmacia.  - Tambin puede pasar por nuestra oficina durante el horario de atencin regular y Education officer, museum una tarjeta de cupones de GoodRx.  - Si necesita que su receta se enve electrnicamente a una farmacia diferente, informe a nuestra oficina a travs de MyChart de [place] o por telfono llamando al [phone] y presione la opcin 4.

## 2022-12-14 ENCOUNTER — Other Ambulatory Visit: Payer: Self-pay

## 2022-12-14 DIAGNOSIS — L2081 Atopic neurodermatitis: Secondary | ICD-10-CM

## 2022-12-14 MED ORDER — DUPIXENT 300 MG/2ML ~~LOC~~ SOAJ: 300.0000 mg | SUBCUTANEOUS | 0 refills | Status: DC

## 2022-12-15 ENCOUNTER — Other Ambulatory Visit: Payer: Self-pay | Admitting: Dermatology

## 2022-12-15 DIAGNOSIS — L649 Androgenic alopecia, unspecified: Secondary | ICD-10-CM

## 2023-01-09 ENCOUNTER — Other Ambulatory Visit: Payer: Self-pay | Admitting: Dermatology

## 2023-01-09 DIAGNOSIS — L2081 Atopic neurodermatitis: Secondary | ICD-10-CM

## 2023-01-11 ENCOUNTER — Other Ambulatory Visit: Payer: Self-pay

## 2023-01-11 DIAGNOSIS — L2081 Atopic neurodermatitis: Secondary | ICD-10-CM

## 2023-01-11 MED ORDER — DUPIXENT 300 MG/2ML ~~LOC~~ SOAJ: SUBCUTANEOUS | 0 refills | Status: DC

## 2023-01-15 ENCOUNTER — Ambulatory Visit (INDEPENDENT_AMBULATORY_CARE_PROVIDER_SITE_OTHER): Payer: BC Managed Care – PPO | Admitting: Dermatology

## 2023-01-15 VITALS — BP 165/83

## 2023-01-15 DIAGNOSIS — Z79899 Other long term (current) drug therapy: Secondary | ICD-10-CM

## 2023-01-15 DIAGNOSIS — L649 Androgenic alopecia, unspecified: Secondary | ICD-10-CM | POA: Diagnosis not present

## 2023-01-15 DIAGNOSIS — L209 Atopic dermatitis, unspecified: Secondary | ICD-10-CM | POA: Diagnosis not present

## 2023-01-15 DIAGNOSIS — D692 Other nonthrombocytopenic purpura: Secondary | ICD-10-CM

## 2023-01-15 MED ORDER — CLOBETASOL PROPIONATE 0.05 % EX CREA
1.0000 | TOPICAL_CREAM | CUTANEOUS | 3 refills | Status: AC
Start: 2023-01-15 — End: ?

## 2023-01-15 MED ORDER — DUPIXENT 300 MG/2ML ~~LOC~~ SOAJ: 300.0000 mg | SUBCUTANEOUS | 5 refills | Status: DC

## 2023-01-15 MED ORDER — MINOXIDIL 2.5 MG PO TABS: 2.5000 mg | ORAL_TABLET | Freq: Every day | ORAL | 1 refills | Status: DC

## 2023-01-15 MED ORDER — FINASTERIDE 5 MG PO TABS: 5.0000 mg | ORAL_TABLET | Freq: Every day | ORAL | 1 refills | Status: DC

## 2023-01-15 NOTE — Patient Instructions (Addendum)
Dermend Bruise Formula for bruising  Gentle Skin Care Guide  1. Bathe no more than once a day.  2. Avoid bathing in hot water  3. Use a mild soap like Dove, Vanicream, Cetaphil, CeraVe. Can use Lever 2000 or Cetaphil antibacterial soap  4. Use soap only where you need it. On most days, use it under your arms, between your legs, and on your feet. Let the water rinse other areas unless visibly dirty.  5. When you get out of the bath/shower, use a towel to gently blot your skin dry, don't rub it.  6. While your skin is still a little damp, apply a moisturizing cream such as Vanicream, CeraVe, Cetaphil, Eucerin, Sarna lotion or plain Vaseline Jelly. For hands apply Neutrogena [place] Hand Cream or Excipial Hand Cream.  7. Reapply moisturizer any time you start to itch or feel dry.  8. Sometimes using free and clear laundry detergents can be helpful. Fabric softener sheets should be avoided. Downy Free & Gentle liquid, or any liquid fabric softener that is free of dyes and perfumes, it acceptable to use  9. If your doctor has given you prescription creams you may apply moisturizers over them     Due to recent changes in healthcare laws, you may see results of your pathology and/or laboratory studies on MyChart before the doctors have had a chance to review them. We understand that in some cases there may be results that are confusing or concerning to you. Please understand that not all results are received at the same time and often the doctors may need to interpret multiple results in order to provide you with the best plan of care or course of treatment. Therefore, we ask that you please give [place] 2 business days to thoroughly review all your results before contacting the office for clarification. Should we see a critical lab result, you will be contacted sooner.   If You Need Anything After Your Visit  If you have any questions or concerns for your doctor, please call our main line at  [phone] and press option 4 to reach your doctor's medical assistant. If no one answers, please leave a voicemail as directed and we will return your call as soon as possible. Messages left after 4 pm will be answered the following business day.   You may also send [place] a message via MyChart. We typically respond to MyChart messages within 1-2 business days.  For prescription refills, please ask your pharmacy to contact our office. Our fax number is [phone].  If you have an urgent issue when the clinic is closed that cannot wait until the next business day, you can page your doctor at the number below.    Please note that while we do our best to be available for urgent issues outside of office hours, we are not available 24/7.   If you have an urgent issue and are unable to reach [place], you may choose to seek medical care at your doctor's office, retail clinic, urgent care center, or emergency room.  If you have a medical emergency, please immediately call 911 or go to the emergency department.  Pager Numbers  - Dr. Gwen Pounds: [phone]  - Dr. Roseanne Reno: [phone]  - Dr. Katrinka Blazing: [phone]   In the event of inclement weather, please call our main line at [phone] for an update on the status of any delays or closures.  Dermatology Medication Tips: Please keep the boxes that topical medications come in in order to help  keep track of the instructions about where and how to use these. Pharmacies typically print the medication instructions only on the boxes and not directly on the medication tubes.   If your medication is too expensive, please contact our office at [phone] option 4 or send [place] a message through MyChart.   We are unable to tell what your co-pay for medications will be in advance as this is different depending on your insurance coverage. However, we may be able to find a substitute medication at lower cost or fill out paperwork to get insurance to cover a needed  medication.   If a prior authorization is required to get your medication covered by your insurance company, please allow [place] 1-2 business days to complete this process.  Drug prices often vary depending on where the prescription is filled and some pharmacies may offer cheaper prices.  The website www.goodrx.com contains coupons for medications through different pharmacies. The prices here do not account for what the cost may be with help from insurance (it may be cheaper with your insurance), but the website can give you the price if you did not use any insurance.  - You can print the associated coupon and take it with your prescription to the pharmacy.  - You may also stop by our office during regular business hours and pick up a GoodRx coupon card.  - If you need your prescription sent electronically to a different pharmacy, notify our office through [facility] or by phone at [phone] option 4.     Si Usted Necesita Algo Despus de Su Visita  Tambin puede enviarnos un mensaje a travs de Clinical cytogeneticist. Por lo general respondemos a los mensajes de MyChart en el transcurso de 1 a 2 das hbiles.  Para renovar recetas, por favor pida a su farmacia que se ponga en contacto con nuestra oficina. Annie Sable de fax es [place] [phone].  Si tiene un asunto urgente cuando la clnica est cerrada y que no puede esperar hasta el siguiente da hbil, puede llamar/localizar a su doctor(a) al nmero que aparece a continuacin.   Por favor, tenga en cuenta que aunque hacemos todo lo posible para estar disponibles para asuntos urgentes fuera del horario de [place], no estamos disponibles las 24 horas del da, los 7 [address]  [address] de la [place].   Si tiene un problema urgente y no puede comunicarse con nosotros, puede optar por buscar atencin mdica  en el consultorio de su doctor(a), en una clnica privada, en un centro de atencin urgente o en una sala de emergencias.  Si tiene Engineer, drilling,  por favor llame inmediatamente al 911 o vaya a la sala de emergencias.  Nmeros de bper  - Dr. Gwen Pounds: [phone]  - Dra. Roseanne Reno: [phone]  - Dr. Katrinka Blazing: [phone]   En caso de inclemencias del tiempo, por favor llame a Lacy Duverney principal al [phone] para una actualizacin sobre el [place] de cualquier retraso o cierre.  Consejos para la medicacin en dermatologa: Por favor, guarde las cajas en las que vienen los medicamentos de uso tpico para ayudarle a seguir las instrucciones sobre dnde y cmo usarlos. Las farmacias generalmente imprimen las instrucciones del medicamento slo en las cajas y no directamente en los tubos del [place].   Si su medicamento es muy caro, por favor, pngase en contacto con Rolm Gala llamando al [phone] y presione la opcin 4 o envenos un mensaje a travs de Clinical cytogeneticist.   No podemos decirle cul ser su copago por  los medicamentos por adelantado ya que esto es diferente dependiendo de la cobertura de su seguro. Sin embargo, es posible que podamos encontrar un medicamento sustituto a Audiological scientist un formulario para que el seguro cubra el medicamento que se considera necesario.   Si se requiere una autorizacin previa para que su compaa de seguros [place] su medicamento, por favor permtanos de 1 a 2 das hbiles para completar [address]  Los precios de los medicamentos varan con frecuencia dependiendo del Environmental consultant de dnde se surte la receta y alguna farmacias pueden ofrecer precios ms baratos.  El sitio web www.goodrx.com tiene cupones para medicamentos de Health and safety inspector. Los precios aqu no tienen en cuenta lo que podra costar con la ayuda del seguro (puede ser ms barato con su seguro), pero el sitio web puede darle el precio si no utiliz Tourist information centre manager.  - Puede imprimir el cupn correspondiente y llevarlo con su receta a la farmacia.  - Tambin puede pasar por nuestra oficina durante el horario de atencin  regular y Education officer, museum una tarjeta de cupones de GoodRx.  - Si necesita que su receta se enve electrnicamente a una farmacia diferente, informe a nuestra oficina a travs de MyChart de [place] o por telfono llamando al [phone] y presione la opcin 4.

## 2023-01-15 NOTE — Progress Notes (Signed)
Follow-Up Visit   Subjective  Caitlyn [name] is a 62 y.o. female who presents for the following: Atopic Derm 90m f/u, hands, arms, back, ankles Dupixent sq injections q 2 wks x 6 mos with improvement, TMC 0.1% cr,~2x/wk, pt having some itching especially on back 4-5 days before next injection, no s/e from Dupixent, Alopecia scalp, Minoxidil 2.5mg  1 po qd, Finasteride 5mg  1 po every day, tolerating well, hair is thicker. No side effects.   The following portions of the chart were reviewed this encounter and updated as appropriate: medications, allergies, medical history  Review of Systems:  No other skin or systemic complaints except as noted in HPI or Assessment and Plan.  Objective  Well appearing patient in no apparent distress; mood and affect are within normal limits.   A focused examination was performed of the following areas: Hands, arms, back  Relevant exam findings are noted in the Assessment and Plan.           Assessment & Plan   ATOPIC DERMATITIS Hands, arms, back, ankles Exam: pink scaly patch R upper arm, L upper back few scattered tiny pink scaly paps 1% BSA  Chronic and persistent condition with duration or expected duration over one year. Condition is symptomatic/ bothersome to patient. Not currently at goal.   Atopic dermatitis - Severe, on Dupixent (biologic medication).  Atopic dermatitis (eczema) is a chronic, relapsing, pruritic condition that can significantly affect quality of life. It is often associated with allergic rhinitis and/or asthma and can require treatment with topical medications, phototherapy, or in severe cases biologic medications, which require long term medication management.    Treatment Plan: Cont Dupixent 300mg /22ml sq injections q 2 wks Start Clobetasol cr qd to bid to aa back until clear, then prn flares, avoid face, groin, axilla  Recommend gentle skin care.   Dupilumab (Dupixent) is a treatment given by injection for  adults and children with moderate-to-severe atopic dermatitis. Goal is control of skin condition, not cure. It is given as 2 injections at the first dose followed by 1 injection ever 2 weeks thereafter.  Young children are dosed monthly.  Potential side effects include allergic reaction, herpes infections, injection site reactions and conjunctivitis (inflammation of the eyes).  The use of Dupixent requires long term medication management, including periodic office visits.  Topical steroids (such as triamcinolone, fluocinolone, fluocinonide, mometasone, clobetasol, halobetasol, betamethasone, hydrocortisone) can cause thinning and lightening of the skin if they are used for too long in the same area. Your physician has selected the right strength medicine for your problem and area affected on the body. Please use your medication only as directed by your physician to prevent side effects.    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  Improvement when compared to baseline photos with narrowing of midline part and hair growth and temporal hairline BL  Chronic and persistent condition with duration or expected duration over one year. Condition is improving with treatment but not currently at goal.   Female Androgenic Alopecia is a chronic condition related to genetics and/or hormonal changes.  In women androgenetic alopecia is commonly associated with menopause but may occur any time after puberty.  It causes hair thinning primarily on the crown with widening of the part and temporal hairline recession.  Can use OTC Rogaine (minoxidil) 5% solution/foam as directed.  Oral treatments in female patients who  have no contraindication may include : - Low dose oral minoxidil 1.25 - 5mg  daily - Spironolactone 50 - 100mg  bid - Finasteride 2.5 - 5 mg daily Adjunctive therapies  include: - Low Level Laser Light Therapy (LLLT) - Platelet-rich plasma injections (PRP) - Hair Transplants or scalp reduction   Treatment Plan: Cont Minoxidil 2.5mg  1 po qd  Cont Finasteride 5mg  1 po qd   Long term medication management.  Patient is using long term (months to years) prescription medication  to control their dermatologic condition.  These medications require periodic monitoring to evaluate for efficacy and side effects and may require periodic laboratory monitoring.     Purpura - Chronic; persistent and recurrent.  Treatable, but not curable. - Violaceous macules and patches - Benign - Related to trauma, age, sun damage and/or use of blood thinners, chronic use of topical and/or oral steroids - Observe - Can use OTC arnica containing moisturizer such as Dermend Bruise Formula if desired - Call for worsening or other concerns   Return in about 6 months (around 07/16/2023) for Atopic Derm, Alopecia.  I, Ardis Rowan, RMA, am acting as scribe for Willeen Niece, MD .   Documentation: I have reviewed the above documentation for accuracy and completeness, and I agree with the above.  Willeen Niece, MD

## 2023-05-09 ENCOUNTER — Other Ambulatory Visit: Payer: Self-pay | Admitting: Obstetrics and Gynecology

## 2023-06-28 ENCOUNTER — Other Ambulatory Visit: Payer: Self-pay | Admitting: Dermatology

## 2023-06-28 DIAGNOSIS — L209 Atopic dermatitis, unspecified: Secondary | ICD-10-CM

## 2023-06-28 DIAGNOSIS — L2081 Atopic neurodermatitis: Secondary | ICD-10-CM

## 2023-07-02 ENCOUNTER — Other Ambulatory Visit: Payer: Self-pay | Admitting: Obstetrics and Gynecology

## 2023-07-09 ENCOUNTER — Other Ambulatory Visit: Payer: Self-pay | Admitting: Internal Medicine

## 2023-07-09 DIAGNOSIS — Z1231 Encounter for screening mammogram for malignant neoplasm of breast: Secondary | ICD-10-CM

## 2023-07-17 ENCOUNTER — Ambulatory Visit: Payer: BC Managed Care – PPO | Admitting: Dermatology

## 2023-07-17 DIAGNOSIS — Z79899 Other long term (current) drug therapy: Secondary | ICD-10-CM | POA: Diagnosis not present

## 2023-07-17 DIAGNOSIS — L649 Androgenic alopecia, unspecified: Secondary | ICD-10-CM

## 2023-07-17 DIAGNOSIS — L209 Atopic dermatitis, unspecified: Secondary | ICD-10-CM | POA: Diagnosis not present

## 2023-07-17 DIAGNOSIS — L2089 Other atopic dermatitis: Secondary | ICD-10-CM

## 2023-07-17 MED ORDER — FINASTERIDE 5 MG PO TABS: 5.0000 mg | ORAL_TABLET | Freq: Every day | ORAL | 1 refills | Status: DC

## 2023-07-17 MED ORDER — MINOXIDIL 2.5 MG PO TABS: 2.5000 mg | ORAL_TABLET | Freq: Every day | ORAL | 1 refills | Status: DC

## 2023-07-17 NOTE — Progress Notes (Signed)
 Follow-Up Visit   Subjective  Caitlyn [name] is a 63 y.o. female who presents for the following: Atopic Dermatitis 6 mo f/u, on Dupixent  injections q 2 weeks, clobetasol  cream as needed. Started Dupixent  06/06/2022.  Doing well, no side effects Androgenetic alopecia, using minoxicil 2.5 mg daily and finasteride  5 mg daily, no side effects, improving.  The following portions of the chart were reviewed this encounter and updated as appropriate: medications, allergies, medical history  Review of Systems:  No other skin or systemic complaints except as noted in HPI or Assessment and Plan.  Objective  Well appearing patient in no apparent distress; mood and affect are within normal limits.  Areas Examined: Face, scalp, trunk, extremities  Relevant physical exam findings are noted in the Assessment and Plan.    Assessment & Plan      ATOPIC DERMATITIS Exam: Healing fissure at right dorsal toe; small pink papule of the left antecubitum <1% BSA  Chronic condition with duration or expected duration over one year. Currently well-controlled on Dupixent .  Atopic dermatitis - Severe, on Dupixent  (biologic medication).  Atopic dermatitis (eczema) is a chronic, relapsing, pruritic condition that can significantly affect quality of life. It is often associated with allergic rhinitis and/or asthma and can require treatment with topical medications, phototherapy, or in severe cases biologic medications, which require long term medication management.    Treatment Plan: Started Dupixent  06/06/2022. Continue Dupixent  300 mg/2mL SQ QOW. Patient denies side effects. Continue Clobetasol  cr once to twice daily as needed for flares. Avoid applying to face, groin, and axilla. Use as directed. Long-term use can cause thinning of the skin.    Dupilumab  (Dupixent ) is a treatment given by injection for adults and children with moderate-to-severe atopic dermatitis. Goal is control of skin condition, not  cure. It is given as 2 injections at the first dose followed by 1 injection ever 2 weeks thereafter.  Young children are dosed monthly.  Potential side effects include allergic reaction, herpes infections, injection site reactions and conjunctivitis (inflammation of the eyes).  The use of Dupixent  requires long term medication management, including periodic office visits.    Long term medication management.  Patient is using long term (months to years) prescription medication  to control their dermatologic condition.  These medications require periodic monitoring to evaluate for efficacy and side effects and may require periodic laboratory monitoring.   Recommend gentle skin care.  ANDROGENETIC ALOPECIA (FEMALE PATTERN HAIR LOSS) Exam: Diffuse thinning of the crown and narrowing of the midline part compared to baseline photos 02/24/2022.   Chronic and persistent condition with duration or expected duration over one year. Condition is improving with treatment but not currently at goal.   Female Androgenic Alopecia is a chronic condition related to genetics and/or hormonal changes.  In women androgenetic alopecia is commonly associated with menopause but may occur any time after puberty.  It causes hair thinning primarily on the crown with widening of the part and temporal hairline recession.  Can use OTC Rogaine  (minoxidil ) 5% solution/foam as directed.  Oral treatments in female patients who have no contraindication may include : - Low dose oral minoxidil  1.25 - 5mg  daily - Spironolactone 50 - 100mg  bid - Finasteride  2.5 - 5 mg daily Adjunctive therapies include: - Low Level Laser Light Therapy (LLLT) - Platelet-rich plasma injections (PRP) - Hair Transplants or scalp reduction   Treatment Plan: BP 160/81 Cont Minoxidil  2.5mg  1 po qd dsp #90 1Rf Cont Finasteride  5mg  1 po every day  dsp #90 1Rf  Doses of oral minoxidil  for hair loss are considered 'low dose'. This is because the doses used  for hair loss are much lower than the doses which are used for conditions such as high blood pressure (hypertension). The doses used for hypertension are 10-40mg  per day.  Side effects are uncommon at the low doses (up to 2.5 mg/day) used to treat hair loss. Potential side effects, more commonly seen at higher doses, include: Increase in hair growth (hypertrichosis) elsewhere on face and body Temporary hair shedding upon starting medication which may last up to 4 weeks Ankle swelling, fluid retention, rapid weight gain more than 5 pounds Low blood pressure and feeling lightheaded or dizzy when standing up quickly Fast or irregular heartbeat Headaches  Long term medication management.  Patient is using long term (months to years) prescription medication  to control their dermatologic condition.  These medications require periodic monitoring to evaluate for efficacy and side effects and may require periodic laboratory monitoring.    Return in about 6 months (around 01/16/2024) for Atopic Dermatitis. Alopecia.  IBernardine Bridegroom, CMA, am acting as scribe for Artemio Larry, MD .   Documentation: I have reviewed the above documentation for accuracy and completeness, and I agree with the above.  Artemio Larry, MD

## 2023-07-17 NOTE — Patient Instructions (Addendum)
 Josefa Ni, MD - [facility] Donnamae Gaba, MD - [place], [facility] Reyna Cava, MD - [facility]  Due to recent changes in healthcare laws, you may see results of your pathology and/or laboratory studies on MyChart before the doctors have had a chance to review them. We understand that in some cases there may be results that are confusing or concerning to you. Please understand that not all results are received at the same time and often the doctors may need to interpret multiple results in order to provide you with the best plan of care or course of treatment. Therefore, we ask that you please give us  2 business days to thoroughly review all your results before contacting the office for clarification. Should we see a critical lab result, you will be contacted sooner.   If You Need Anything After Your Visit  If you have any questions or concerns for your doctor, please call our main line at [phone] and press option 4 to reach your doctor's medical assistant. If no one answers, please leave a voicemail as directed and we will return your call as soon as possible. Messages left after 4 pm will be answered the following business day.   You may also send us  a message via MyChart. We typically respond to MyChart messages within 1-2 business days.  For prescription refills, please ask your pharmacy to contact our office. Our fax number is [phone].  If you have an urgent issue when the clinic is closed that cannot wait until the next business day, you can page your doctor at the number below.    Please note that while we do our best to be available for urgent issues outside of office hours, we are not available 24/7.   If you have an urgent issue and are unable to reach us , you may choose to seek medical care at your doctor's office, retail clinic, urgent care center, or emergency room.  If you have a medical emergency, please immediately call 911 or go to the emergency  department.  Pager Numbers  - Dr. Bary Likes: [phone]  - Dr. Annette Barters: [phone]  - Dr. Felipe Horton: [phone]   In the event of inclement weather, please call our main line at [phone] for an update on the status of any delays or closures.  Dermatology Medication Tips: Please keep the boxes that topical medications come in in order to help keep track of the instructions about where and how to use these. Pharmacies typically print the medication instructions only on the boxes and not directly on the medication tubes.   If your medication is too expensive, please contact our office at [phone] option 4 or send us  a message through MyChart.   We are unable to tell what your co-pay for medications will be in advance as this is different depending on your insurance coverage. However, we may be able to find a substitute medication at lower cost or fill out paperwork to get insurance to cover a needed medication.   If a prior authorization is required to get your medication covered by your insurance company, please allow us  1-2 business days to complete this process.  Drug prices often vary depending on where the prescription is filled and some pharmacies may offer cheaper prices.  The website www.goodrx.com contains coupons for medications through different pharmacies. The prices here do not account for what the cost may be with help from insurance (it may be cheaper with your insurance), but the website can  give you the price if you did not use any insurance.  - You can print the associated coupon and take it with your prescription to the pharmacy.  - You may also stop by our office during regular business hours and pick up a GoodRx coupon card.  - If you need your prescription sent electronically to a different pharmacy, notify our office through [facility] or by phone at [phone] option 4.     Si Usted Necesita Algo Despus de Su Visita  Tambin puede enviarnos  un mensaje a travs de Clinical cytogeneticist. Por lo general respondemos a los mensajes de MyChart en el transcurso de 1 a 2 das hbiles.  Para renovar recetas, por favor pida a su farmacia que se ponga en contacto con nuestra oficina. Franz Jacks de fax es [place] [phone].  Si tiene un asunto urgente cuando la clnica est cerrada y que no puede esperar hasta el siguiente da hbil, puede llamar/localizar a su doctor(a) al nmero que aparece a continuacin.   Por favor, tenga en cuenta que aunque hacemos todo lo posible para estar disponibles para asuntos urgentes fuera del horario de [place], no estamos disponibles las 24 horas del da, los 7 [address]  [address] de la [place].   Si tiene un problema urgente y no puede comunicarse con nosotros, puede optar por buscar atencin mdica  en el consultorio de su doctor(a), en una clnica privada, en un centro de atencin urgente o en una sala de emergencias.  Si tiene Engineer, drilling, por favor llame inmediatamente al 911 o vaya a la sala de emergencias.  Nmeros de bper  - Dr. Bary Likes: [phone]  - Dra. Annette Barters: [phone]  - Dr. Felipe Horton: [phone]   En caso de inclemencias del tiempo, por favor llame a Lajuan Pila principal al [phone] para una actualizacin sobre el [place] de cualquier retraso o cierre.  Consejos para la medicacin en dermatologa: Por favor, guarde las cajas en las que vienen los medicamentos de uso tpico para ayudarle a seguir las instrucciones sobre dnde y cmo usarlos. Las farmacias generalmente imprimen las instrucciones del medicamento slo en las cajas y no directamente en los tubos del [place].   Si su medicamento es muy caro, por favor, pngase en contacto con Bettyjane Brunet llamando al [phone] y presione la opcin 4 o envenos un mensaje a travs de Clinical cytogeneticist.   No podemos decirle cul ser su copago por los medicamentos por adelantado ya que esto es diferente dependiendo de la cobertura de su seguro. Sin  embargo, es posible que podamos encontrar un medicamento sustituto a Audiological scientist un formulario para que el seguro cubra el medicamento que se considera necesario.   Si se requiere una autorizacin previa para que su compaa de seguros [place] su medicamento, por favor permtanos de 1 a 2 das hbiles para completar este proceso.  Los precios de los medicamentos varan con frecuencia dependiendo del Environmental consultant de dnde se surte la receta y alguna farmacias pueden ofrecer precios ms baratos.  El sitio web www.goodrx.com tiene cupones para medicamentos de Health and safety inspector. Los precios aqu no tienen en cuenta lo que podra costar con la ayuda del seguro (puede ser ms barato con su seguro), pero el sitio web puede darle el precio si no utiliz Tourist information centre manager.  - Puede imprimir el cupn correspondiente y llevarlo con su receta a la farmacia.  - Tambin puede pasar por nuestra oficina durante el horario de atencin regular y Education officer, museum una tarjeta de cupones de GoodRx.  -  Si necesita que su receta se enve electrnicamente a Psychiatrist, informe a nuestra oficina a travs de MyChart de [place] o por telfono llamando al [phone] y presione la opcin 4.

## 2023-07-23 ENCOUNTER — Ambulatory Visit
Admission: RE | Admit: 2023-07-23 | Discharge: 2023-07-23 | Disposition: A | Discharge status: Home / Self Care | Payer: Self-pay | Source: Ambulatory Visit | Attending: Internal Medicine | Admitting: Internal Medicine

## 2023-07-23 DIAGNOSIS — Z1231 Encounter for screening mammogram for malignant neoplasm of breast: Secondary | ICD-10-CM | POA: Insufficient documentation

## 2023-07-25 NOTE — Progress Notes (Signed)
 GYNECOLOGY: ANNUAL EXAM   Subjective:    PCP: Westley Hammers, MD Caitlyn [name] is a 63 y.o. female [phone] who presents for annual wellness visit.  1) Takes Prempro  for menopausal symptoms, mainly hot flashes; she has tried to wean and continues to have horrible hot flashes, desires to continue.   Of note, BP elevated 158/76, PMH of HTN and has not taken medications yet. Denies HA, vision changes, palpitations, dizziness/lightheadedness, or weakness.   Well Woman Visit:  GYN HISTORY:  No LMP recorded. Patient is postmenopausal.     Menstrual History: OB History     Gravida  5   Para  2   Term  2   Preterm      AB  3   Living  2      SAB  3   IAB      Ectopic      Multiple      Live Births  2           Menarche age: 61 No LMP recorded. Patient is postmenopausal.    Intermenstrual bleeding, spotting, or discharge? no Urinary incontinence? yes  Sexually active: yes Number of sexual partners: 1 Gender of sexual Partners:  Social History   Substance and Sexual Activity  Sexual Activity Yes   Birth control/protection: Post-menopausal   Dyspareunia? no STI history: no STI/HIV testing or immunizations needed? No.   Health Maintenance: -Last pap: approximate date 04/12/22 and was normal nilm hpv neg --> Any abnormals: no -Last mammogram: 07/23/23 --> Any abnormals? no -Last colon cancer screen: 10/05/20 / Type: colonoscopy -Last DEXA scan: no -[facility] of Breast / Colon / Cervical cancer: breast cancer aunt. PGF colon -Vaccines:  Immunization History  Administered Date(s) Administered   Influenza,inj,Quad PF,6+ Mos 12/19/2018   PFIZER(Purple Top)SARS-COV-2 Vaccination 05/18/2019, 06/11/2019   Last Tdap: unsure / Flu: utd / COVID: utd / Gardasil: age out / Shingles (50+): has had one  / PCV20: maybe not sure -Hep C screen: pcp -Last lipid / glucose screening: pcp  > Exercise: , moderately active > Dietary Supplements: Folate: Yes;  Calcium:  Yes}; Vitamin D: Yes > Body mass index is 26.64 kg/m.  > Recent dental visit Yes.   > Seat Belt Use: Yes.   > Texting and driving? No. > Guns in the house Yes.   > Recreational or other drug use: denied.   Social History   Tobacco Use   Smoking status: Never   Smokeless tobacco: Never  Substance Use Topics   Alcohol use: Yes    Comment: 2 month   Occupation: retired    Lives with: husband     PHQ-2 Score: In last two weeks, how often have you felt: Little interest or pleasure in doing things: Not at all (0) Feeling down, depressed or hopeless: Not at all (0) Score: 0  GAD-2 Over the last 2 weeks, how often have you been bothered by the following problems? Feeling nervous, anxious or on edge: Not at all (0) Not being able to stop or control worrying: Not at all (0)} Score:0 _________________________________________________________  Current Outpatient Medications  Medication Sig Dispense Refill   albuterol (PROVENTIL HFA;VENTOLIN HFA) 108 (90 Base) MCG/ACT inhaler Inhale into the lungs every 6 (six) hours as needed for wheezing or shortness of breath.     budesonide-formoterol (SYMBICORT) 160-4.5 MCG/ACT inhaler Inhale 2 puffs into the lungs 2 (two) times daily.     calcium-vitamin D (OSCAL WITH D) 250-125 MG-UNIT tablet  Take 1 tablet by mouth daily.     celecoxib (CELEBREX) 100 MG capsule Take 100 mg by mouth 2 (two) times daily.     cetirizine (ZYRTEC) 10 MG tablet Take 10 mg by mouth daily.     clobetasol  cream (TEMOVATE ) 0.05 % Apply 1 Application topically as directed. qd to bid aa eczema on back until clear, then prn flares, avoid face, groin, axilla 60 g 3   Dupilumab  (DUPIXENT ) 300 MG/2ML SOAJ Inject 300mg /22mL every 14 days SQ 4 mL 0   DUPIXENT  300 MG/2ML SOAJ INJECT 1 PEN UNDER THE SKIN EVERY 14 DAYS 4 mL 5   finasteride  (PROSCAR ) 5 MG tablet Take 1 tablet (5 mg total) by mouth daily. 90 tablet 1   fluticasone (FLONASE) 50 MCG/ACT nasal spray Place into both  nostrils daily.     hydrALAZINE (APRESOLINE) 25 MG tablet Take 25 mg by mouth 3 (three) times daily.     hydrochlorothiazide (HYDRODIURIL) 25 MG tablet Take 25 mg by mouth daily.     irbesartan (AVAPRO) 300 MG tablet Take by mouth daily.     labetalol (NORMODYNE) 100 MG tablet Take 100 mg by mouth 2 (two) times daily.     methylphenidate 36 MG PO CR tablet Take 36 mg by mouth every morning.     minoxidil  (LONITEN ) 2.5 MG tablet Take 1 tablet (2.5 mg total) by mouth daily. 90 tablet 1   montelukast (SINGULAIR) 10 MG tablet Take 10 mg by mouth at bedtime.     tacrolimus  (PROTOPIC ) 0.1 % ointment Apply topically as directed. Qd to bid aa eczema face, body prn flares 100 g 3   triamcinolone  cream (KENALOG ) 0.1 % Apply 1 Application topically as directed. Qd to bid up to 5 days per week to eczema flares until clear, avoid face, groin, axilla 80 g 1   estrogen, conjugated,-medroxyprogesterone (PREMPRO ) 0.625-5 MG tablet Take 1 tablet by mouth daily. 90 tablet 3   No current facility-administered medications for this visit.   No Known Allergies  Past Medical History:  Diagnosis Date   Anxiety    Arthritis    Asthma    Constipation    Cystocele    GERD (gastroesophageal reflux disease)    Hypertension    Increased BMI    Menopause    Menorrhagia    Seasonal allergies    Stress incontinence    Past Surgical History:  Procedure Laterality Date   ABLATION     CARPAL TUNNEL RELEASE     CESAREAN SECTION     x 2   DILATION AND CURETTAGE OF UTERUS     HYSTEROSCOPY     LAPAROSCOPIC GASTRIC SLEEVE RESECTION     ROTATOR CUFF REPAIR Left 11/2020    Review Of Systems  Constitutional: Denied constitutional symptoms, night sweats, recent illness, fatigue, fever, insomnia and weight loss.  Eyes: Denied eye symptoms, eye pain, photophobia, vision change and visual disturbance.  Ears/Nose/Throat/Neck: Denied ear, nose, throat or neck symptoms, hearing loss, nasal discharge, sinus congestion and  sore throat.  Cardiovascular: Denied cardiovascular symptoms, arrhythmia, chest pain/pressure, edema, exercise intolerance, orthopnea and palpitations.  Respiratory: Denied pulmonary symptoms, asthma, pleuritic pain, productive sputum, cough, dyspnea and wheezing.  Gastrointestinal: Denied, gastro-esophageal reflux, melena, nausea and vomiting.  Genitourinary: Denied genitourinary symptoms including symptomatic vaginal discharge, pelvic relaxation issues, and urinary complaints.  Musculoskeletal: Denied musculoskeletal symptoms, stiffness, swelling, muscle weakness and myalgia.  Dermatologic: Denied dermatology symptoms, rash and scar.  Neurologic: Denied neurology symptoms, dizziness, headache, neck pain  and syncope.  Psychiatric: Denied psychiatric symptoms, anxiety and depression.  Endocrine: Hot flashes      Objective:    BP (!) 158/76   Pulse 71   Ht 5\' 1"  (1.549 m)   Wt 141 lb (64 kg)   BMI 26.64 kg/m   Constitutional: Well-developed, well-nourished female in no acute distress Neurological: Alert and oriented to person, place, and time Psychiatric: Mood and affect appropriate Skin: No rashes or lesions Neck: Supple without masses. Trachea is midline.Thyroid is normal size without masses Lymphatics: No cervical, axillary, supraclavicular, or inguinal adenopathy noted Respiratory: Clear to auscultation bilaterally. Good air movement with normal work of breathing. Cardiovascular: Regular rate and rhythm. Extremities grossly normal, nontender with no edema; pulses regular Gastrointestinal: Soft, nontender, nondistended. No masses or hernias appreciated. No hepatosplenomegaly. No fluid wave. No rebound or guarding. Breast Exam: normal appearance, no masses or tenderness, Inspection negative, No nipple retraction or dimpling, No nipple discharge or bleeding, No axillary or supraclavicular adenopathy, Normal to palpation without dominant masses Genitourinary:         External  Genitalia: Normal female genitalia    Vagina: Normal mucosa, no lesions.    Cervix: No lesions, normal size and consistency; no cervical motion tenderness; non-friable; Pap not obtained.    Uterus: Normal size and contour; smooth, mobile, NT. Adnexae: Non-palpable and non-tender Perineum/Anus: No lesions Rectal: deferred    Assessment/Plan:    Caitlyn [name] is a 63 y.o. female [phone] with normal well-woman gynecologic exam.  -Screenings:  Pap: due 03/2027; discussed ASCCP recommendations for ageing out, pt will consider Mammogram: completed 07/23/23 Colon: PCP Labs: PCP GAD/PHQ-2 = 0 -Vaccines: Recommend PCV20 -Healthy lifestyle modifications discussed: multivitamin, diet, exercise, sunscreen, tobacco and alcohol use. Emphasized importance of regular physical activity.  -Calcium and Vit D recommendation reviewed.  -Prempro  refilled x 12 mos -All questions answered to patient's satisfaction.  -RTC 1 yr for annual, sooner prn.     Sofia Dunn, DO  OB/GYN at [facility]

## 2023-07-25 NOTE — Patient Instructions (Signed)
 Preventive Care 16-64 Years Old, Female  Preventive care refers to lifestyle choices and visits with your health care provider that can promote health and wellness. Preventive care visits are also called wellness exams.  What can I expect for my preventive care visit?  Counseling  Your health care provider may ask you questions about your:  Medical history, including:  Past medical problems.  Family medical history.  Pregnancy history.  Current health, including:  Menstrual cycle.  Method of birth control.  Emotional well-being.  Home life and relationship well-being.  Sexual activity and sexual health.  Lifestyle, including:  Alcohol, nicotine or tobacco, and drug use.  Access to firearms.  Diet, exercise, and sleep habits.  Work and work Astronomer.  Sunscreen use.  Safety issues such as seatbelt and bike helmet use.  Physical exam  Your health care provider will check your:  Height and weight. These may be used to calculate your BMI (body mass index). BMI is a measurement that tells if you are at a healthy weight.  Waist circumference. This measures the distance around your waistline. This measurement also tells if you are at a healthy weight and may help predict your risk of certain diseases, such as type 2 diabetes and high blood pressure.  Heart rate and blood pressure.  Body temperature.  Skin for abnormal spots.  What immunizations do I need?    Vaccines are usually given at various ages, according to a schedule. Your health care provider will recommend vaccines for you based on your age, medical history, and lifestyle or other factors, such as travel or where you work.  What tests do I need?  Screening  Your health care provider may recommend screening tests for certain conditions. This may include:  Lipid and cholesterol levels.  Diabetes screening. This is done by checking your blood sugar (glucose) after you have not eaten for a while (fasting).  Pelvic exam and Pap test.  Hepatitis B test.  Hepatitis C  test.  HIV (human immunodeficiency virus) test.  STI (sexually transmitted infection) testing, if you are at risk.  Lung cancer screening.  Colorectal cancer screening.  Mammogram. Talk with your health care provider about when you should start having regular mammograms. This may depend on whether you have a family history of breast cancer.  BRCA-related cancer screening. This may be done if you have a family history of breast, ovarian, tubal, or peritoneal cancers.  Bone density scan. This is done to screen for osteoporosis.  Talk with your health care provider about your test results, treatment options, and if necessary, the need for more tests.  Follow these instructions at home:  Eating and drinking    Eat a diet that includes fresh fruits and vegetables, whole grains, lean protein, and low-fat dairy products.  Take vitamin and mineral supplements as recommended by your health care provider.  Do not drink alcohol if:  Your health care provider tells you not to drink.  You are pregnant, may be pregnant, or are planning to become pregnant.  If you drink alcohol:  Limit how much you have to 0-1 drink a day.  Know how much alcohol is in your drink. In the [place] one drink equals one 12 oz bottle of beer (355 mL), one 5 oz glass of wine (148 mL), or one 1 oz glass of hard liquor (44 mL).  Lifestyle  Brush your teeth every morning and night with fluoride toothpaste. Floss one time each day.  Exercise for at least  30 minutes 5 or more days each week.  Do not use any products that contain nicotine or tobacco. These products include cigarettes, chewing tobacco, and vaping devices, such as e-cigarettes. If you need help quitting, ask your health care provider.  Do not use drugs.  If you are sexually active, practice safe sex. Use a condom or other form of protection to prevent STIs.  If you do not wish to become pregnant, use a form of birth control. If you plan to become pregnant, see your health care provider for a  prepregnancy visit.  Take aspirin only as told by your health care provider. Make sure that you understand how much to take and what form to take. Work with your health care provider to find out whether it is safe and beneficial for you to take aspirin daily.  Find healthy ways to manage stress, such as:  Meditation, yoga, or listening to music.  Journaling.  Talking to a trusted person.  Spending time with friends and family.  Minimize exposure to UV radiation to reduce your risk of skin cancer.  Safety  Always wear your seat belt while driving or riding in a vehicle.  Do not drive:  If you have been drinking alcohol. Do not ride with someone who has been drinking.  When you are tired or distracted.  While texting.  If you have been using any mind-altering substances or drugs.  Wear a helmet and other protective equipment during sports activities.  If you have firearms in your house, make sure you follow all gun safety procedures.  Seek help if you have been physically or sexually abused.  What's next?  Visit your health care provider once a year for an annual wellness visit.  Ask your health care provider how often you should have your eyes and teeth checked.  Stay up to date on all vaccines.  This information is not intended to replace advice given to you by your health care provider. Make sure you discuss any questions you have with your health care provider.  Document Revised: 09/01/2020 Document Reviewed: 09/01/2020  Elsevier Patient Education  2024 [employer].

## 2023-07-27 ENCOUNTER — Ambulatory Visit (INDEPENDENT_AMBULATORY_CARE_PROVIDER_SITE_OTHER): Payer: Self-pay | Admitting: Obstetrics

## 2023-07-27 ENCOUNTER — Encounter: Payer: Self-pay | Admitting: Obstetrics

## 2023-07-27 VITALS — BP 158/76 | HR 71 | Ht 61.0 in | Wt 141.0 lb

## 2023-07-27 DIAGNOSIS — Z79890 Hormone replacement therapy (postmenopausal): Secondary | ICD-10-CM

## 2023-07-27 DIAGNOSIS — Z01419 Encounter for gynecological examination (general) (routine) without abnormal findings: Secondary | ICD-10-CM

## 2023-07-27 MED ORDER — PREMPRO 0.625-5 MG PO TABS: 1.0000 | ORAL_TABLET | Freq: Every day | ORAL | 3 refills | Status: DC

## 2023-08-10 ENCOUNTER — Telehealth: Payer: Self-pay

## 2023-08-10 NOTE — Telephone Encounter (Signed)
 error

## 2023-08-17 ENCOUNTER — Telehealth: Payer: Self-pay

## 2023-08-17 NOTE — Telephone Encounter (Signed)
 Caitlyn [name]

## 2023-08-20 ENCOUNTER — Other Ambulatory Visit: Payer: Self-pay | Admitting: Obstetrics

## 2023-08-20 DIAGNOSIS — Z79890 Hormone replacement therapy (postmenopausal): Secondary | ICD-10-CM

## 2023-08-20 MED ORDER — ESTRADIOL 0.5 MG PO TABS: 0.5000 mg | ORAL_TABLET | Freq: Every day | ORAL | 3 refills | Status: AC

## 2023-08-20 MED ORDER — MEDROXYPROGESTERONE ACETATE 2.5 MG PO TABS: 2.5000 mg | ORAL_TABLET | Freq: Every day | ORAL | 3 refills | Status: AC

## 2023-10-18 LAB — LIPID PANEL
Cholesterol: 184 (ref 0–200)
HDL: 73 — AB (ref 35–70)
LDL Cholesterol: 97
Triglycerides: 79 (ref 40–160)

## 2023-10-18 LAB — TSH: TSH: 2.41 (ref 0.41–5.90)

## 2023-10-18 LAB — CBC AND DIFFERENTIAL
HCT: 37 (ref 36–46)
Hemoglobin: 12 (ref 12.0–16.0)
Platelets: 235 K/uL (ref 150–400)
WBC: 5.4

## 2023-10-18 LAB — HEMOGLOBIN A1C: Hemoglobin A1C: 5.4

## 2023-10-18 LAB — CBC: RBC: 3.82 — AB (ref 3.87–5.11)

## 2023-10-19 LAB — LAB REPORT - SCANNED
A1c: 5.4
TSH: 2.41 (ref 0.41–5.90)

## 2023-11-05 ENCOUNTER — Ambulatory Visit (INDEPENDENT_AMBULATORY_CARE_PROVIDER_SITE_OTHER)
Admission: RE | Admit: 2023-11-05 | Discharge: 2023-11-05 | Disposition: A | Discharge status: Home / Self Care | Source: Ambulatory Visit | Attending: General Practice | Admitting: General Practice

## 2023-11-05 ENCOUNTER — Ambulatory Visit: Admitting: General Practice

## 2023-11-05 ENCOUNTER — Encounter: Payer: Self-pay | Admitting: General Practice

## 2023-11-05 VITALS — BP 112/52 | HR 67 | Temp 98.1°F | Ht 61.0 in | Wt 140.0 lb

## 2023-11-05 DIAGNOSIS — M5442 Lumbago with sciatica, left side: Secondary | ICD-10-CM

## 2023-11-05 DIAGNOSIS — Z7689 Persons encountering health services in other specified circumstances: Secondary | ICD-10-CM | POA: Insufficient documentation

## 2023-11-05 DIAGNOSIS — M5441 Lumbago with sciatica, right side: Secondary | ICD-10-CM

## 2023-11-05 DIAGNOSIS — G8929 Other chronic pain: Secondary | ICD-10-CM | POA: Diagnosis not present

## 2023-11-05 DIAGNOSIS — F909 Attention-deficit hyperactivity disorder, unspecified type: Secondary | ICD-10-CM

## 2023-11-05 DIAGNOSIS — F5104 Psychophysiologic insomnia: Secondary | ICD-10-CM | POA: Diagnosis not present

## 2023-11-05 DIAGNOSIS — E118 Type 2 diabetes mellitus with unspecified complications: Secondary | ICD-10-CM

## 2023-11-05 NOTE — Patient Instructions (Addendum)
 Complete xray(s) prior to leaving today. I will notify you of your results once received.  Decrease Klonopin to 1/2 tablet for two weeks.   Please send the lab results to the email provided.   Follow up in 2 weeks.   Please get me the records for the ADHD screening.   It was a pleasure to meet you today! Please don't hesitate to contact me with any questions. Welcome to [facility]!

## 2023-11-05 NOTE — Assessment & Plan Note (Signed)
 EMR reviewed briefly.

## 2023-11-05 NOTE — Progress Notes (Signed)
 New Patient Office Visit  Subjective    Patient ID: Caitlyn [name], female    DOB: 1960/09/02  Age: 64 y.o. MRN: [id number]  CC:  Chief Complaint  Patient presents with   New Patient (Initial Visit)    HPI Caitlyn [name] is a 63 y.o. female presents to establish care.  Physical/labs/last PCP: Honor Cork  Discussed the use of AI scribe software for clinical note transcription with the patient, who gave verbal consent to proceed.  History of Present Illness She has been experiencing chronic back pain for over six months, primarily located from the mid to lower back, with occasional radiation down her leg along the sciatic nerve. The pain is severe and sometimes necessitates sitting down immediately. It is exacerbated by activities such as squatting or reaching into low cabinets. No incontinence of urine or stool is associated with the back pain.  She has a history of bilateral rotator cuff surgeries and is currently using gabapentin and methocarbamol for pain management, which she finds helpful but not sufficiently long-lasting.  She has been diagnosed with ADHD and has been taking Vyvanse . However, she needs to provide medical records to continue receiving this medication and is in the process of obtaining these records from her previous provider.  She has been taking clonazepam to help with sleep issues. It is not as effective as it used to be, and she wants to switch to a different medication. She has been taking clonazepam for several years, primarily to 'calm my brain down' at bedtime.  She mentions a past diagnosis of type 2 diabetes, which she believes is now controlled without medication. She has not been on diabetes medication for years and undergoes annual checks to monitor her condition.   Outpatient Encounter Medications as of 11/05/2023  Medication Sig   albuterol (PROVENTIL HFA;VENTOLIN HFA) 108 (90 Base) MCG/ACT inhaler Inhale into the lungs every 6 (six) hours as  needed for wheezing or shortness of breath.   budesonide-formoterol (SYMBICORT) 160-4.5 MCG/ACT inhaler Inhale 2 puffs into the lungs 2 (two) times daily.   buPROPion (WELLBUTRIN XL) 150 MG 24 hr tablet Take 150 mg by mouth daily.   calcium-vitamin D (OSCAL WITH D) 250-125 MG-UNIT tablet Take 1 tablet by mouth daily.   celecoxib (CELEBREX) 100 MG capsule Take 100 mg by mouth 2 (two) times daily.   cetirizine (ZYRTEC) 10 MG tablet Take 10 mg by mouth daily.   clobetasol  cream (TEMOVATE ) 0.05 % Apply 1 Application topically as directed. qd to bid aa eczema on back until clear, then prn flares, avoid face, groin, axilla   clonazePAM (KLONOPIN) 1 MG tablet Take 0.5-1 mg by mouth at bedtime.   Dupilumab  (DUPIXENT ) 300 MG/2ML SOAJ Inject 300mg /18mL every 14 days SQ   DUPIXENT  300 MG/2ML SOAJ INJECT 1 PEN UNDER THE SKIN EVERY 14 DAYS   estradiol  (ESTRACE ) 0.5 MG tablet Take 1 tablet (0.5 mg total) by mouth daily.   finasteride  (PROSCAR ) 5 MG tablet Take 1 tablet (5 mg total) by mouth daily.   fluticasone (FLONASE) 50 MCG/ACT nasal spray Place into both nostrils daily.   gabapentin (NEURONTIN) 300 MG capsule Take 300 mg by mouth at bedtime.   hydrALAZINE (APRESOLINE) 25 MG tablet Take 25 mg by mouth 3 (three) times daily.   hydrochlorothiazide (HYDRODIURIL) 25 MG tablet Take 25 mg by mouth daily.   irbesartan (AVAPRO) 300 MG tablet Take by mouth daily.   labetalol (NORMODYNE) 100 MG tablet Take 100 mg by mouth 2 (two)  times daily.   lisdexamfetamine (VYVANSE ) 60 MG capsule Take 60 mg by mouth every morning.   medroxyPROGESTERone  (PROVERA ) 2.5 MG tablet Take 1 tablet (2.5 mg total) by mouth daily.   methocarbamol (ROBAXIN) 500 MG tablet Take 500 mg by mouth every 6 (six) hours as needed for muscle spasms.   minoxidil  (LONITEN ) 2.5 MG tablet Take 1 tablet (2.5 mg total) by mouth daily.   Multiple Vitamin (MULTI-VITAMIN) tablet Take 1 tablet by mouth every morning.   tacrolimus  (PROTOPIC ) 0.1 % ointment  Apply topically as directed. Qd to bid aa eczema face, body prn flares   triamcinolone  cream (KENALOG ) 0.1 % Apply 1 Application topically as directed. Qd to bid up to 5 days per week to eczema flares until clear, avoid face, groin, axilla   valACYclovir (VALTREX) 1000 MG tablet Take 1,000 mg by mouth daily.   busPIRone (BUSPAR) 15 MG tablet Take 15 mg by mouth 2 (two) times daily.   FLUoxetine (PROZAC) 40 MG capsule Take 40 mg by mouth 2 (two) times daily.   [DISCONTINUED] methylphenidate 36 MG PO CR tablet Take 36 mg by mouth every morning.   [DISCONTINUED] montelukast (SINGULAIR) 10 MG tablet Take 10 mg by mouth at bedtime.   No facility-administered encounter medications on file as of 11/05/2023.    Past Medical History:  Diagnosis Date   Anxiety    Arthritis    Asthma    Constipation    Cystocele    GERD (gastroesophageal reflux disease)    Hypertension    Increased BMI    Menopause    Menorrhagia    Seasonal allergies    Stress incontinence     Past Surgical History:  Procedure Laterality Date   ABLATION     CARPAL TUNNEL RELEASE     CESAREAN SECTION     x 2   DILATION AND CURETTAGE OF UTERUS     HYSTEROSCOPY     LAPAROSCOPIC GASTRIC SLEEVE RESECTION     ROTATOR CUFF REPAIR Left 11/2020    Family History  Problem Relation Age of Onset   Breast cancer Maternal Aunt 35   Colon cancer Paternal Grandfather    Diabetes Paternal Grandfather    Ovarian cancer Neg Hx    Heart disease Neg Hx     Social History   Socioeconomic History   Marital status: Widowed    Spouse name: Not on file   Number of children: Not on file   Years of education: Not on file   Highest education level: Not on file  Occupational History   Not on file  Tobacco Use   Smoking status: Never   Smokeless tobacco: Never  Vaping Use   Vaping status: Never Used  Substance and Sexual Activity   Alcohol use: Yes    Comment: 2 month   Drug use: No   Sexual activity: Yes    Birth  control/protection: Post-menopausal  Other Topics Concern   Not on file  Social History Narrative   Not on file   Social Drivers of Health   Financial Resource Strain: Not on file  Food Insecurity: Not on file  Transportation Needs: Not on file  Physical Activity: Not on file  Stress: Not on file  Social Connections: Not on file  Intimate Partner Violence: Not on file    Review of Systems  Constitutional:  Negative for chills and fever.  Respiratory:  Negative for shortness of breath.   Cardiovascular:  Negative for chest pain.  Gastrointestinal:  Negative for abdominal pain, constipation, diarrhea, heartburn, nausea and vomiting.  Genitourinary:  Negative for dysuria, frequency and urgency.  Musculoskeletal:  Positive for joint pain.  Neurological:  Negative for dizziness and headaches.  Endo/Heme/Allergies:  Negative for polydipsia.  Psychiatric/Behavioral:  Negative for depression and suicidal ideas. The patient is not nervous/anxious.         Objective    BP (!) 112/52   Pulse 67   Temp 98.1 F (36.7 C) (Oral)   Ht 5' 1 (1.549 m)   Wt 140 lb (63.5 kg)   SpO2 97%   BMI 26.45 kg/m   Physical Exam Vitals and nursing note reviewed.  Constitutional:      Appearance: Normal appearance.  Cardiovascular:     Rate and Rhythm: Normal rate and regular rhythm.     Pulses: Normal pulses.     Heart sounds: Normal heart sounds.  Pulmonary:     Effort: Pulmonary effort is normal.     Breath sounds: Normal breath sounds.  Musculoskeletal:     Cervical back: Normal range of motion.     Thoracic back: Normal range of motion.     Lumbar back: No swelling or bony tenderness. Decreased range of motion. Negative right straight leg raise test and negative left straight leg raise test.  Neurological:     Mental Status: She is alert and oriented to person, place, and time.  Psychiatric:        Mood and Affect: Mood normal.        Behavior: Behavior normal.        Thought  Content: Thought content normal.        Judgment: Judgment normal.         Assessment & Plan:  Chronic midline low back pain with bilateral sciatica -     DG Lumbar Spine Complete  Establishing care with new doctor, encounter for Assessment & Plan: EMR reviewed briefly.    Controlled type 2 diabetes mellitus with complication, without long-term current use of insulin (HCC)  Attention deficit hyperactivity disorder (ADHD), unspecified ADHD type  Chronic insomnia    Assessment and Plan Assessment & Plan Chronic low back pain with sciatica Chronic low back pain with leg radiation, suggestive of sciatica. Differential includes sciatica flare-up, compression fracture, or pinched nerve. - Order x-ray of lumbar spine to assess for compression fracture or pinched nerve. - Continue gabapentin and methocarbamol for pain management. - Refer to orthopedics if x-ray findings are concerning. - Advised ER visit if incontinence of urine or stool occurs.  ADHD (diagnosis confirmation and management) Vyvanse  prescription requires confirmation due to lack of records. Discussed options for obtaining records or rescreening by psychiatrist. - Obtain medical records from previous PCP to confirm ADHD diagnosis.  Chronic insomnia (benzodiazepine taper and alternative therapy) Chronic insomnia managed with clonazepam, not preferred due to addiction potential. Clonazepam ineffective. Plan to taper clonazepam and initiate trazodone. Discussed potential withdrawal symptoms. - Taper clonazepam by taking half tablet for two weeks, then stop. - Prescribe trazodone for sleep after clonazepam taper is complete. - Schedule follow-up in two weeks to assess progress and adjust trazodone dosage if needed.  Type 2 diabetes mellitus, controlled  Type 2 diabetes mellitus previously controlled without medication.  - Obtain recent lab results from [facility] to review A1c.        Return in about 2 weeks (around  11/19/2023) for back pain and ADHD screening.SABRA Carrol [place], NP

## 2023-11-07 ENCOUNTER — Telehealth: Payer: Self-pay

## 2023-11-07 DIAGNOSIS — F909 Attention-deficit hyperactivity disorder, unspecified type: Secondary | ICD-10-CM

## 2023-11-07 NOTE — Telephone Encounter (Signed)
 Looks like there were some records received from her previous practice; is this all what you needed? Patient needing Vyvanse  refilled

## 2023-11-07 NOTE — Telephone Encounter (Signed)
 Copied from CRM [id number]. Topic: Medical Record Request - Other >> Nov 07, 2023 12:25 PM Sasha M wrote: Reason for CRM: Pt calling in to see if we have received her records from [facility]. She is specifically wanting to know if we have her ADHD records as she is running low on her Vivance medication and will be out in 4 days. Please call pt at [phone] to advise

## 2023-11-08 NOTE — Telephone Encounter (Signed)
 Called and spoke with patient and let her know Carrol is still reviewing her records and will send in the refill for the vyvanse . Patient states she did come back and have the xray done already.

## 2023-11-09 MED ORDER — LISDEXAMFETAMINE DIMESYLATE 60 MG PO CAPS: 60.0000 mg | ORAL_CAPSULE | Freq: Every morning | ORAL | 0 refills | Status: AC

## 2023-11-09 NOTE — Addendum Note (Signed)
 Addended by: VINCENTE SHIVERS on: 11/09/2023 04:16 PM   Modules accepted: Orders

## 2023-11-13 NOTE — Telephone Encounter (Unsigned)
 Copied from CRM #[id number]. Topic: General - Other >> Nov 13, 2023  3:18 PM Mesmerise C wrote: Reason for CRM: Patient returning call for Harlene read message verbatim, patient inquiring what is part of the contract and would like to discuss details patient would like a callback at [phone]

## 2023-11-13 NOTE — Telephone Encounter (Signed)
Left message for patient about message below.

## 2023-11-14 NOTE — Telephone Encounter (Signed)
 LMTCB

## 2023-11-17 ENCOUNTER — Ambulatory Visit: Payer: Self-pay | Admitting: General Practice

## 2023-11-17 DIAGNOSIS — G8929 Other chronic pain: Secondary | ICD-10-CM

## 2023-11-20 ENCOUNTER — Ambulatory Visit: Admitting: General Practice

## 2023-11-20 NOTE — Telephone Encounter (Signed)
 Copied from CRM #[id number]. Topic: Clinical - Lab/Test Results >> Nov 20, 2023  2:55 PM Burnard DEL wrote: Reason for CRM: Patient called in to let provider know that she is okay with physical therapy referral being sent out. She would prefer a  location. She also stated that  she doesn't need to speak with CMA regarding vyvanse  medication since she has already picked up her prescription.

## 2023-11-20 NOTE — Telephone Encounter (Signed)
 LMTCB

## 2023-11-20 NOTE — Telephone Encounter (Signed)
 Patient called back and told [email or web address] agent she does not need to speak with me any longer and will discuss at her OV.

## 2023-11-20 NOTE — Addendum Note (Signed)
 Addended by: VINCENTE SHIVERS on: 11/20/2023 03:10 PM   Modules accepted: Orders

## 2023-11-28 ENCOUNTER — Ambulatory Visit: Admitting: General Practice

## 2023-11-28 ENCOUNTER — Encounter: Payer: Self-pay | Admitting: General Practice

## 2023-11-28 VITALS — BP 108/62 | HR 78 | Temp 98.6°F | Ht 61.0 in | Wt 141.0 lb

## 2023-11-28 DIAGNOSIS — F909 Attention-deficit hyperactivity disorder, unspecified type: Secondary | ICD-10-CM

## 2023-11-28 DIAGNOSIS — M5442 Lumbago with sciatica, left side: Secondary | ICD-10-CM

## 2023-11-28 DIAGNOSIS — Z5181 Encounter for therapeutic drug level monitoring: Secondary | ICD-10-CM | POA: Diagnosis not present

## 2023-11-28 DIAGNOSIS — M5441 Lumbago with sciatica, right side: Secondary | ICD-10-CM | POA: Diagnosis not present

## 2023-11-28 DIAGNOSIS — G8929 Other chronic pain: Secondary | ICD-10-CM

## 2023-11-28 DIAGNOSIS — E118 Type 2 diabetes mellitus with unspecified complications: Secondary | ICD-10-CM

## 2023-11-28 DIAGNOSIS — G4700 Insomnia, unspecified: Secondary | ICD-10-CM

## 2023-11-28 DIAGNOSIS — Z0289 Encounter for other administrative examinations: Secondary | ICD-10-CM

## 2023-11-28 DIAGNOSIS — I10 Essential (primary) hypertension: Secondary | ICD-10-CM

## 2023-11-28 MED ORDER — HYDROCHLOROTHIAZIDE 25 MG PO TABS: 25.0000 mg | ORAL_TABLET | Freq: Every day | ORAL | 2 refills | Status: AC

## 2023-11-28 NOTE — Patient Instructions (Addendum)
 [facility] [address] [place] [address] [phone]  Call and schedule appt for physical therapy.   Let me know when  you need the refill for Vyvanse .   Call and schedule appt with Dr. Watt.   I will be in touch regarding the sleep medication.   Follow up in 3 months.  It was a pleasure to see you today!

## 2023-11-28 NOTE — Progress Notes (Signed)
 Established Patient Office Visit  Subjective   Patient ID: Caitlyn [name], female    DOB: 1961-02-15  Age: 63 y.o. MRN: [id number]  Chief Complaint  Patient presents with   Medical Management of Chronic Issues   Back Pain    Unchanged. At times can be worse.     Back Pain Pertinent negatives include no abdominal pain, chest pain, dysuria, fever or headaches.    Caitlyn [name] is a 63 year old female with past medical history HTN, DM2, chronic back pain, HLD, insomnia, ADHD presents today for a follow up.   Discussed the use of AI scribe software for clinical note transcription with the patient, who gave verbal consent to proceed.  History of Present Illness Caitlyn [name] is a 63 year old female who presents for medication management and follow-up.  She has been on ADHD medication for over 20 years and is currently taking Vyvanse . She would like a refill. She declines psychologist referral at this time.   She experiences significant back pain, described as frequent and severe. An x-ray performed on November 05, 2023, showed arthritis and degenerative changes. She has been referred to physical therapy but has not yet scheduled an appointment. She has tried various medications including Celebrex, methocarbamol, and gabapentin, with limited relief.  She mentions a past history of being told she was a controlled diabetic, but she has not been informed of a diabetes diagnosis recently. She recalls managing her blood sugar through weight loss and lifestyle changes in the past. Recent A1C was 5.4.   She is no longer taking clonazepam and reports difficulty sleeping, stating 'I cannot sleep at all.' She is seeking an alternative medication to help with sleep.  She is out of refills for her blood pressure medication, HCTZ, and requires a new prescription.   Patient Active Problem List   Diagnosis Date Noted   Attention deficit hyperactivity disorder (ADHD) 11/05/2023   Establishing  care with new doctor, encounter for 11/05/2023   Chronic midline low back pain with bilateral sciatica 11/05/2023   Chronic insomnia 11/05/2023   Post-menopause on HRT (hormone replacement therapy) 03/12/2019   Marital stress 12/28/2016   Menopausal symptom 12/28/2016   Hx of gastric bypass 11/03/2015   Vaginal atrophy 11/03/2015   Menopausal state 11/03/2015   Diabetes mellitus type 2, controlled, with complications (HCC) 07/27/2014   HTN (hypertension) 07/27/2014   Moderate mitral regurgitation 07/27/2014   Pure hypercholesterolemia 07/27/2014   Past Medical History:  Diagnosis Date   Anxiety    Arthritis    Asthma    Constipation    Cystocele    GERD (gastroesophageal reflux disease)    Hypertension    Increased BMI    Menopause    Menorrhagia    Seasonal allergies    Stress incontinence    Past Surgical History:  Procedure Laterality Date   ABLATION     CARPAL TUNNEL RELEASE     CESAREAN SECTION     x 2   DILATION AND CURETTAGE OF UTERUS     HYSTEROSCOPY     LAPAROSCOPIC GASTRIC SLEEVE RESECTION     ROTATOR CUFF REPAIR Left 11/2020   No Known Allergies        No data to display              No data to display            Review of Systems  Constitutional:  Negative for chills and fever.  Respiratory:  Negative for shortness of breath.   Cardiovascular:  Negative for chest pain.  Gastrointestinal:  Negative for abdominal pain, constipation, diarrhea, heartburn, nausea and vomiting.  Genitourinary:  Negative for dysuria, frequency and urgency.  Musculoskeletal:  Positive for back pain.  Neurological:  Negative for dizziness and headaches.  Endo/Heme/Allergies:  Negative for polydipsia.  Psychiatric/Behavioral:  Negative for depression and suicidal ideas. The patient is not nervous/anxious.       Objective:     BP 108/62   Pulse 78   Temp 98.6 F (37 C) (Oral)   Ht 5' 1 (1.549 m)   Wt 141 lb (64 kg)   SpO2 98%   BMI 26.64 kg/m  BP  Readings from Last 3 Encounters:  11/28/23 108/62  11/05/23 (!) 112/52  07/27/23 (!) 158/76   Wt Readings from Last 3 Encounters:  11/28/23 141 lb (64 kg)  11/05/23 140 lb (63.5 kg)  07/27/23 141 lb (64 kg)      Physical Exam Vitals and nursing note reviewed.  Constitutional:      Appearance: Normal appearance.  Cardiovascular:     Rate and Rhythm: Normal rate and regular rhythm.     Pulses: Normal pulses.     Heart sounds: Normal heart sounds.  Pulmonary:     Effort: Pulmonary effort is normal.     Breath sounds: Normal breath sounds.  Neurological:     Mental Status: She is alert and oriented to person, place, and time.  Psychiatric:        Mood and Affect: Mood normal.        Behavior: Behavior normal.        Thought Content: Thought content normal.        Judgment: Judgment normal.      No results found for any visits on 11/28/23.     The ASCVD Risk score (Arnett DK, et al., 2019) failed to calculate for the following reasons:   Cannot find a previous HDL lab   Cannot find a previous total cholesterol lab    Assessment & Plan:  Attention deficit hyperactivity disorder (ADHD), unspecified ADHD type -     POCT Urine drug screen  Chronic midline low back pain with bilateral sciatica  Medication management contract signed -     POCT Urine drug screen  Encounter for medication monitoring  Insomnia, unspecified type  Controlled type 2 diabetes mellitus with complication, without long-term current use of insulin (HCC) -     Microalbumin / creatinine urine ratio -     Comprehensive metabolic panel with GFR  Hypertension, unspecified type -     hydroCHLOROthiazide ; Take 1 tablet (25 mg total) by mouth daily.  Dispense: 30 tablet; Refill: 2  Assessment and Plan Assessment & Plan Chronic low back pain with bilateral sciatica Likely due to degenerative changes and arthritis. Current medications provide inadequate relief. Physical therapy not yet initiated. -  Follow up with physical therapy provider to schedule appointments. - Schedule appointment with Caitlyn [name], orthopedic specialist, for further evaluation and management options.   Attention-deficit hyperactivity disorder (ADHD), unspecified type Long-standing ADHD managed with Vyvanse . Discussed non-opiate controlled medication contract and annual urine drug screen as standard procedure. - Continue Vyvanse  at current dose. - Sign non-opiate controlled medication contract. - Perform annual urine drug screen. - Schedule follow-up every 3 months to assess medication tolerance and side effects.  Chronic insomnia Significant difficulty sleeping.  Discussed at length.  given the medications she is on, discussed with patient that I  will be in touch with her to see which medication will be safe for her to take.   Hypertension Managed with hydrochlorothiazide . Reports running out of refills. - Refill hydrochlorothiazide  prescription.  Type 2 diabetes mellitus, controlled without medication Controlled without medication. Recent labs show good control. Explained need for regular monitoring. - CMP and Urine ACR pending.   Return in about 3 months (around 02/27/2024) for adhd .    Carrol Aurora, NP

## 2023-11-29 ENCOUNTER — Ambulatory Visit: Payer: Self-pay | Admitting: General Practice

## 2023-11-29 ENCOUNTER — Telehealth: Payer: Self-pay

## 2023-11-29 ENCOUNTER — Encounter: Payer: Self-pay | Admitting: Pharmacist

## 2023-11-29 ENCOUNTER — Other Ambulatory Visit: Payer: Self-pay | Admitting: General Practice

## 2023-11-29 DIAGNOSIS — R944 Abnormal results of kidney function studies: Secondary | ICD-10-CM

## 2023-11-29 DIAGNOSIS — F5104 Psychophysiologic insomnia: Secondary | ICD-10-CM

## 2023-11-29 LAB — COMPREHENSIVE METABOLIC PANEL WITH GFR
ALT: 35 U/L (ref 0–35)
AST: 31 U/L (ref 0–37)
Albumin: 4.2 g/dL (ref 3.5–5.2)
Alkaline Phosphatase: 63 U/L (ref 39–117)
BUN: 26 mg/dL — ABNORMAL HIGH (ref 6–23)
CO2: 25 meq/L (ref 19–32)
Calcium: 9.8 mg/dL (ref 8.4–10.5)
Chloride: 106 meq/L (ref 96–112)
Creatinine, Ser: 1.04 mg/dL (ref 0.40–1.20)
GFR: 56.74 mL/min — ABNORMAL LOW
Glucose, Bld: 119 mg/dL — ABNORMAL HIGH (ref 70–99)
Potassium: 3.6 meq/L (ref 3.5–5.1)
Sodium: 139 meq/L (ref 135–145)
Total Bilirubin: 0.5 mg/dL (ref 0.2–1.2)
Total Protein: 6.5 g/dL (ref 6.0–8.3)

## 2023-11-29 LAB — MICROALBUMIN / CREATININE URINE RATIO
Creatinine,U: 94.1 mg/dL
Microalb Creat Ratio: 17.6 mg/g (ref 0.0–30.0)
Microalb, Ur: 1.7 mg/dL (ref 0.0–1.9)

## 2023-11-29 MED ORDER — DAYVIGO 5 MG PO TABS: 5.0000 mg | ORAL_TABLET | Freq: Every evening | ORAL | 0 refills | Status: AC | PRN

## 2023-11-29 NOTE — Telephone Encounter (Signed)
 Copied from CRM #[id number]. Topic: General - Other >> Nov 29, 2023  1:32 PM Aaliyah S wrote: Reason for CRM: patient is calling to speak with pcp from recent call. Please contact the patient regarding concerns

## 2023-11-29 NOTE — Progress Notes (Signed)
 Chart Review Reason: Drug information Question  Summary: Reviewed current medication list per Epic.   Safety considerations:  Dayvigo  may increase risk of CNS depression/sedation when used with medications with similar risk. Reassuringly, patient no longer on clonazepam. Is taking gabapentin 300 mg once daily which she tolerated with with clonazepam previously. Given no longer on clonazepam, this combination is reasonable with ongoing monitoring/assessment.   Dayvigo  in some can have residual drowsiness after waking. Monitoring for tolerability recommended.   Significant drug interactions exist with CYP3A4-metabolized medications. No concerns regarding current medication list. Consider interaction with starting new medications.    Considerations: Overall, DORA medication such as Dayvigo  is reasonable to consider. Short-term use or long-term use has been studied up to 12 months. Not unreasonable to use beyond 12 months, though with ongoing assessment for need/benefit.

## 2023-11-29 NOTE — Telephone Encounter (Signed)
 Spoke with patient about message below and she states there was an issue with her prescription for the Lemborexant  (DAYVIGO ) 5 MG TABS. I also received notification of this from the pharmacy. Do you want to change this or have PA done?

## 2023-11-29 NOTE — Progress Notes (Signed)
 Called and discussed with patient at length.  Agreeable to start Dayvigo  5 mg at bedtime as needed for sleep. Rx sent.  She will update if symptoms are better or worse.   -Carrol Aurora, DNP, AGNP-C 11/29/2023 1:29 PM

## 2023-11-30 ENCOUNTER — Other Ambulatory Visit (HOSPITAL_COMMUNITY): Payer: Self-pay

## 2023-11-30 ENCOUNTER — Telehealth: Payer: Self-pay

## 2023-11-30 NOTE — Telephone Encounter (Signed)
 Pharmacy Patient Advocate Encounter   Received notification from Pt Calls Messages that prior authorization for DayVigo  5MG  tablets  is required/requested.   Insurance verification completed.   The patient is insured through [facility] .   Per test claim: PA required; PA started via CoverMyMeds. KEY [id number] . Please see clinical question(s) below that I am not finding the answer to in their chart and advise.

## 2023-11-30 NOTE — Telephone Encounter (Signed)
 PA request has been Started. New Encounter has been or will be created for follow up. For additional info see Pharmacy Prior Auth telephone encounter from 11/30/23.

## 2023-12-02 NOTE — Progress Notes (Signed)
 Michell Kader T. Brenisha Tsui, MD, CAQ Sports Medicine [facility] [address] [place], [address]  Phone: [phone]  FAX: [phone]  Caitlyn [name] - 63 y.o. female  MRN [id number]  Date of Birth: [birth year]  Date: 12/05/2023  PCP: Vincente Shivers, NP  Referral: Vincente Shivers, NP  Chief Complaint  Patient presents with   Back Pain   Subjective:   Caitlyn [name] is a 63 y.o. very pleasant female patient with Body mass index is 26.5 kg/m. who presents with the following:  Discussed the use of AI scribe software for clinical note transcription with the patient, who gave verbal consent to proceed.  Patient presents with ongoing back pain.  History is significant for prior sleeve gastrectomy. History of Present Illness Summar Caitlyn [name] is a 63 year old female who presents with chronic lower back pain radiating to the buttocks and legs.  She has been experiencing persistent lower back pain radiating into the buttocks and legs, initially more on the left side but now affecting both sides. The pain is described as sometimes 'jolting' and has been present for many months. No history of back surgery, fractures, or significant trauma. 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  She manages her back pain with stretches and medications including methocarbamol  and gabapentin . Methocarbamol  is taken as needed, while gabapentin  is used primarily at night and occasionally in the afternoon if the pain worsens. She has not yet undergone physical therapy. She has not visited a chiropractor recently and has not had recent massages.  She underwent bariatric surgery in the past, specifically a sleeve gastrectomy. She has a history of diabetes, which resolved post-surgery, and she no longer takes medication for it.  She reports poor sleep quality and is awaiting a medication to aid with sleep, which has  been delayed due to prior authorization issues. She engages in walking and stretching to stay active.  No groin pain, numbness, or tingling in the legs. Reports soreness in the hips but no significant hip pain. The pain is primarily in the lower back and sacral area, with some involvement of the SI joints.    Review of Systems is noted in the HPI, as appropriate  Objective:   BP 104/60   Pulse 75   Temp (!) 97.4 F (36.3 C) (Temporal)   Ht 5' 1 (1.549 m)   Wt 140 lb 4 oz (63.6 kg)   SpO2 98%   BMI 26.50 kg/m   GEN: No acute distress; alert,appropriate. PULM: Breathing comfortably in no respiratory distress PSYCH: Normally interactive.    Range of motion at  the waist: Flexion, extension, lateral bending and rotation: Mild restriction of motion Olin  No echymosis or edema Rises to examination table with mild difficulty Gait: minimally antalgic  Inspection/Deformity: N Paraspinus Tenderness: Diffuse L2-S1 bilaterally Diffuse tenderness in the posterior pelvis and bilateral piriformis  B Ankle Dorsiflexion (L5,4): 5/5 B Great Toe Dorsiflexion (L5,4): 5/5 Heel Walk (L5): WNL Toe Walk (S1): WNL Rise/Squat (L4): WNL, mild pain  SENSORY B Medial Foot (L4): WNL B Dorsum (L5): WNL B Lateral (S1): WNL Light Touch: WNL Pinprick: WNL  REFLEXES Knee (L4): 2+ Ankle (S1): 2+  B SLR, seated: neg B SLR, supine: neg B FABER: Posterior buttocks tenderness B Reverse FABER: Posterior buttocks tenderness B Greater Troch: Mildly tender to palpation bilaterally B Log Roll: neg B Sciatic Notch: Bilaterally tender to palpation  Physical Exam MUSCULOSKELETAL: Tenderness in lower back and SI joints. Hip range of motion normal. Limited range of motion in spine with soreness. NEUROLOGICAL: Sensation intact in lower extremities. Strength 5/5 in lower extremities.  Laboratory and Imaging Data:  Assessment and Plan:     ICD-10-CM   1. Chronic midline low back pain with bilateral  sciatica  M54.41    M54.42    G89.29     2. 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 Pain management complicated by bariatric surgery, limiting NSAID use. - Refer to physical therapy for back rehabilitation. - Consider chiropractic manipulation after physical therapy. - Prescribe a course of steroids to reduce nerve irritation. - Refill methocarbamol  for muscle relaxation. - Refill gabapentin  for nerve pain management. - Provide back and hip rehabilitation exercises to start before physical therapy.  Bilateral trochanteric bursitis Bilateral trochanteric bursitis secondary to chronic low back pain, contributing to hip pain. - Include hip rehabilitation in physical therapy referral. - Provide hip rehabilitation exercises to start before physical therapy.  History of bariatric surgery (sleeve gastrectomy) Sleeve gastrectomy limits NSAID use due to ulcer and bleeding risk. She was not previously informed of this risk. - Educate on avoiding NSAIDs post-bariatric surgery.  Medication Management during today's office visit: Meds ordered this encounter  Medications   predniSONE  (DELTASONE ) 20 MG tablet    Sig: 2 tabs po daily for 5 days, then 1 tab po daily for 5 days    Dispense:  15 tablet    Refill:  0   methocarbamol  (ROBAXIN ) 500 MG tablet    Sig: Take 1 tablet (500 mg total) by mouth 3 (three) times daily as needed for muscle spasms.    Dispense:  50 tablet    Refill:  3   gabapentin  (NEURONTIN ) 300 MG capsule    Sig: Take 1 capsule (300 mg total) by mouth at bedtime.    Dispense:  90 capsule    Refill:  1   Medications Discontinued During This Encounter  Medication Reason   clonazePAM (KLONOPIN) 1 MG tablet Completed  Course   triamcinolone  cream (KENALOG ) 0.1 % Completed Course   gabapentin  (NEURONTIN ) 300 MG capsule Reorder   methocarbamol  (ROBAXIN ) 500 MG tablet Reorder    Orders placed today for conditions managed today: No orders of the defined types were placed in this encounter.   Disposition: No follow-ups on file.  Dragon [employer] One speech-to-text software was used for transcription in this dictation.  Possible transcriptional errors can occur using Animal nutritionist.   Signed,  Jacques DASEN. Chistian Kasler, MD   Outpatient Encounter Medications as of 12/05/2023  Medication Sig   albuterol (PROVENTIL HFA;VENTOLIN HFA) 108 (90 Base) MCG/ACT inhaler Inhale into the lungs every 6 (six) hours as needed for wheezing or shortness of breath.   budesonide-formoterol (SYMBICORT) 160-4.5 MCG/ACT inhaler Inhale 2 puffs into the lungs 2 (two) times daily.   buPROPion (WELLBUTRIN XL) 150 MG 24 hr tablet Take 150 mg by mouth daily.   busPIRone (BUSPAR) 15 MG tablet Take 15 mg by mouth 2 (two) times daily.   calcium-vitamin D (OSCAL WITH D) 250-125 MG-UNIT tablet Take 1 tablet by mouth daily.   celecoxib (CELEBREX) 100 MG capsule Take 100 mg by mouth 2 (two) times daily.   cetirizine (ZYRTEC)  10 MG tablet Take 10 mg by mouth daily.   clobetasol  cream (TEMOVATE ) 0.05 % Apply 1 Application topically as directed. qd to bid aa eczema on back until clear, then prn flares, avoid face, groin, axilla   Dupilumab  (DUPIXENT ) 300 MG/2ML SOAJ Inject 300mg /84mL every 14 days SQ   estradiol  (ESTRACE ) 0.5 MG tablet Take 1 tablet (0.5 mg total) by mouth daily.   finasteride  (PROSCAR ) 5 MG tablet Take 1 tablet (5 mg total) by mouth daily.   FLUoxetine (PROZAC) 40 MG capsule Take 40 mg by mouth 2 (two) times daily.   fluticasone (FLONASE) 50 MCG/ACT nasal spray Place into both nostrils daily.   hydrALAZINE (APRESOLINE) 25 MG tablet Take 25 mg by mouth 3 (three) times daily.   hydrochlorothiazide  (HYDRODIURIL ) 25 MG tablet Take 1  tablet (25 mg total) by mouth daily.   irbesartan (AVAPRO) 300 MG tablet Take by mouth daily.   labetalol (NORMODYNE) 100 MG tablet Take 100 mg by mouth 2 (two) times daily.   Lemborexant  (DAYVIGO ) 5 MG TABS Take 1 tablet (5 mg total) by mouth at bedtime as needed.   lisdexamfetamine (VYVANSE ) 60 MG capsule Take 1 capsule (60 mg total) by mouth every morning.   medroxyPROGESTERone  (PROVERA ) 2.5 MG tablet Take 1 tablet (2.5 mg total) by mouth daily.   minoxidil  (LONITEN ) 2.5 MG tablet Take 1 tablet (2.5 mg total) by mouth daily.   Multiple Vitamin (MULTI-VITAMIN) tablet Take 1 tablet by mouth every morning.   predniSONE  (DELTASONE ) 20 MG tablet 2 tabs po daily for 5 days, then 1 tab po daily for 5 days   tacrolimus  (PROTOPIC ) 0.1 % ointment Apply topically as directed. Qd to bid aa eczema face, body prn flares   valACYclovir (VALTREX) 1000 MG tablet Take 1,000 mg by mouth daily.   [DISCONTINUED] gabapentin  (NEURONTIN ) 300 MG capsule Take 300 mg by mouth at bedtime.   [DISCONTINUED] methocarbamol  (ROBAXIN ) 500 MG tablet Take 500 mg by mouth every 6 (six) hours as needed for muscle spasms.   gabapentin  (NEURONTIN ) 300 MG capsule Take 1 capsule (300 mg total) by mouth at bedtime.   methocarbamol  (ROBAXIN ) 500 MG tablet Take 1 tablet (500 mg total) by mouth 3 (three) times daily as needed for muscle spasms.   [DISCONTINUED] clonazePAM (KLONOPIN) 1 MG tablet Take 0.5-1 mg by mouth at bedtime. (Patient not taking: Reported on 11/28/2023)   [DISCONTINUED] triamcinolone  cream (KENALOG ) 0.1 % Apply 1 Application topically as directed. Qd to bid up to 5 days per week to eczema flares until clear, avoid face, groin, axilla (Patient not taking: Reported on 11/28/2023)   No facility-administered encounter medications on file as of 12/05/2023.

## 2023-12-03 NOTE — Telephone Encounter (Signed)
 Information has been sent to clinical pharmacist for appeals review. It may take 5-7 days to prepare the necessary documentation to request the appeal from the insurance.

## 2023-12-03 NOTE — Telephone Encounter (Signed)
 Clinical questions have been answered and PA submitted. PA currently Pending.  NEW KEY: [id number]

## 2023-12-04 ENCOUNTER — Other Ambulatory Visit (HOSPITAL_COMMUNITY): Payer: Self-pay

## 2023-12-04 ENCOUNTER — Telehealth: Payer: Self-pay | Admitting: Pharmacist

## 2023-12-04 NOTE — Telephone Encounter (Signed)
 Appeal has been submitted for Dayvigo . Will advise when response is received, please be advised that most companies may take 30 days to make a decision. Appeal letter and supporting documentation have been faxed to [phone] on 12/04/2023 @9 :51 am.  Thank you, Devere Pandy, PharmD Clinical Pharmacist  [facility]  Direct Dial: [phone]

## 2023-12-05 ENCOUNTER — Encounter: Payer: Self-pay | Admitting: Family Medicine

## 2023-12-05 ENCOUNTER — Ambulatory Visit: Admitting: Family Medicine

## 2023-12-05 VITALS — BP 104/60 | HR 75 | Temp 97.4°F | Ht 61.0 in | Wt 140.2 lb

## 2023-12-05 DIAGNOSIS — M5442 Lumbago with sciatica, left side: Secondary | ICD-10-CM | POA: Diagnosis not present

## 2023-12-05 DIAGNOSIS — M5441 Lumbago with sciatica, right side: Secondary | ICD-10-CM | POA: Diagnosis not present

## 2023-12-05 DIAGNOSIS — R944 Abnormal results of kidney function studies: Secondary | ICD-10-CM | POA: Diagnosis not present

## 2023-12-05 DIAGNOSIS — G8929 Other chronic pain: Secondary | ICD-10-CM

## 2023-12-05 MED ORDER — PREDNISONE 20 MG PO TABS: ORAL_TABLET | ORAL | 0 refills | Status: AC

## 2023-12-05 MED ORDER — GABAPENTIN 300 MG PO CAPS: 300.0000 mg | ORAL_CAPSULE | Freq: Every day | ORAL | 1 refills | Status: AC

## 2023-12-05 MED ORDER — METHOCARBAMOL 500 MG PO TABS: 500.0000 mg | ORAL_TABLET | Freq: Three times a day (TID) | ORAL | 3 refills | Status: AC | PRN

## 2023-12-06 ENCOUNTER — Ambulatory Visit: Payer: Self-pay | Admitting: General Practice

## 2023-12-06 ENCOUNTER — Encounter: Payer: Self-pay | Admitting: Family Medicine

## 2023-12-06 LAB — BASIC METABOLIC PANEL WITH GFR
BUN: 28 mg/dL — ABNORMAL HIGH (ref 6–23)
CO2: 24 meq/L (ref 19–32)
Calcium: 9.7 mg/dL (ref 8.4–10.5)
Chloride: 104 meq/L (ref 96–112)
Creatinine, Ser: 1.04 mg/dL (ref 0.40–1.20)
GFR: 56.74 mL/min — ABNORMAL LOW (ref >60.00)
Glucose, Bld: 105 mg/dL — ABNORMAL HIGH (ref 70–99)
Potassium: 4.1 meq/L (ref 3.5–5.1)
Sodium: 135 meq/L (ref 135–145)

## 2023-12-07 ENCOUNTER — Other Ambulatory Visit: Payer: Self-pay | Admitting: General Practice

## 2023-12-07 ENCOUNTER — Telehealth: Payer: Self-pay | Admitting: General Practice

## 2023-12-07 ENCOUNTER — Other Ambulatory Visit (HOSPITAL_COMMUNITY): Payer: Self-pay

## 2023-12-07 DIAGNOSIS — F419 Anxiety disorder, unspecified: Secondary | ICD-10-CM

## 2023-12-07 DIAGNOSIS — F5104 Psychophysiologic insomnia: Secondary | ICD-10-CM

## 2023-12-07 MED ORDER — TRAZODONE HCL 50 MG PO TABS: 50.0000 mg | ORAL_TABLET | Freq: Every day | ORAL | 0 refills | Status: AC

## 2023-12-07 NOTE — Telephone Encounter (Signed)
 Spoke with patient and she would like to try the trazodone . Advised patient of the risks and she states she will monitor herself for any side effects.

## 2023-12-07 NOTE — Telephone Encounter (Signed)
 Patient wants to know if theres something she can do while the appeal is in process?

## 2023-12-07 NOTE — Telephone Encounter (Signed)
 F/u schedule for 02/27/24

## 2023-12-07 NOTE — Telephone Encounter (Signed)
 Copied from CRM [id number]. Topic: Clinical - Medication Prior Auth >> Dec 06, 2023  4:44 PM Caitlyn [name] wrote: Reason for CRM: Patient was calling back to check on the status for her medication  Lemborexant  (DAYVIGO ) 5 MG TABS. Did advised her on the note that was left by Devere Dessert has been submitted for Dayvigo . Will advise when response is received, please be advised that most companies may take 30 days to make a decision. Appeal letter and supporting documentation have been faxed to [phone] on 12/04/2023 @9 :51 am. Patient wants to know what do she do during that time because she's not sleeping and doesn't have 30days. Callback number [phone]

## 2023-12-07 NOTE — Addendum Note (Signed)
 Addended by: VINCENTE SHIVERS on: 12/07/2023 12:49 PM   Modules accepted: Orders

## 2023-12-25 ENCOUNTER — Other Ambulatory Visit (HOSPITAL_COMMUNITY): Payer: Self-pay

## 2024-01-01 ENCOUNTER — Other Ambulatory Visit: Payer: Self-pay | Admitting: Dermatology

## 2024-01-01 DIAGNOSIS — L2081 Atopic neurodermatitis: Secondary | ICD-10-CM

## 2024-01-02 ENCOUNTER — Other Ambulatory Visit (HOSPITAL_COMMUNITY): Payer: Self-pay

## 2024-01-08 ENCOUNTER — Encounter: Payer: Self-pay | Admitting: Dermatology

## 2024-01-08 ENCOUNTER — Ambulatory Visit: Payer: Self-pay | Admitting: Dermatology

## 2024-01-08 DIAGNOSIS — L209 Atopic dermatitis, unspecified: Secondary | ICD-10-CM

## 2024-01-08 DIAGNOSIS — L2081 Atopic neurodermatitis: Secondary | ICD-10-CM

## 2024-01-08 DIAGNOSIS — L649 Androgenic alopecia, unspecified: Secondary | ICD-10-CM

## 2024-01-08 DIAGNOSIS — Z79899 Other long term (current) drug therapy: Secondary | ICD-10-CM

## 2024-01-08 MED ORDER — FINASTERIDE 5 MG PO TABS: 5.0000 mg | ORAL_TABLET | Freq: Every day | ORAL | 3 refills | Status: AC

## 2024-01-08 MED ORDER — DUPIXENT 300 MG/2ML ~~LOC~~ SOAJ: SUBCUTANEOUS | 5 refills | Status: AC

## 2024-01-08 MED ORDER — MINOXIDIL 2.5 MG PO TABS: 2.5000 mg | ORAL_TABLET | Freq: Every day | ORAL | 3 refills | Status: AC

## 2024-01-08 NOTE — Patient Instructions (Addendum)
 Cont Minoxidil  2.5mg  1 daily  Cont Finasteride  5mg  1 daily   Continue Dupixent  300 mg/2mL subcutaneous injection every 14 days.   Continue Clobetasol  cr once to twice daily as needed for flares. Avoid applying to face, groin, and axilla. Use as directed. Long-term use can cause thinning of the skin.   Dupilumab  (Dupixent ) is a treatment given by injection for adults and children with moderate-to-severe atopic dermatitis. Goal is control of skin condition, not cure. It is given as 2 injections at the first dose followed by 1 injection ever 2 weeks thereafter.  Young children are dosed monthly.  Potential side effects include allergic reaction, herpes infections, injection site reactions and conjunctivitis (inflammation of the eyes).  The use of Dupixent  requires long term medication management, including periodic office visits.   Topical steroids (such as triamcinolone , fluocinolone, fluocinonide, mometasone, clobetasol , halobetasol, betamethasone, hydrocortisone) can cause thinning and lightening of the skin if they are used for too long in the same area. Your physician has selected the right strength medicine for your problem and area affected on the body. Please use your medication only as directed by your physician to prevent side effects.      Doses of oral minoxidil  for hair loss are considered 'low dose'. This is because the doses used for hair loss are much lower than the doses which are used for conditions such as high blood pressure (hypertension). The doses used for hypertension are 10-40mg  per day.  Side effects are uncommon at the low doses (up to 2.5 mg/day) used to treat hair loss. Potential side effects, more commonly seen at higher doses, include: Increase in hair growth (hypertrichosis) elsewhere on face and body Temporary hair shedding upon starting medication which may last up to 4 weeks Ankle swelling, fluid retention, rapid weight gain more than 5 pounds Low blood pressure  and feeling lightheaded or dizzy when standing up quickly Fast or irregular heartbeat Headaches

## 2024-01-08 NOTE — Progress Notes (Signed)
 Follow-Up Visit   Subjective  Caitlyn [name] is a 63 y.o. female who presents for the following: Atopic Dermatitis 6 mo f/u, on Dupixent  injections q 2 weeks, clobetasol  cream as needed. Started Dupixent  06/06/2022.  Doing well, no side effects Androgenetic alopecia, using minoxidil  2.5 mg daily and finasteride  5 mg daily, no side effects, improving.  The following portions of the chart were reviewed this encounter and updated as appropriate: medications, allergies, medical history  Review of Systems:  No other skin or systemic complaints except as noted in HPI or Assessment and Plan.  Objective  Well appearing patient in no apparent distress; mood and affect are within normal limits.  Areas Examined: Face, scalp, trunk, extremities  Relevant physical exam findings are noted in the Assessment and Plan.    Assessment & Plan   ATOPIC NEURODERMATITIS   Related Medications tacrolimus  (PROTOPIC ) 0.1 % ointment Apply topically as directed. Qd to bid aa eczema face, body prn flares Dupilumab  (DUPIXENT ) 300 MG/2ML SOAJ INJECT 1 PEN UNDER THE SKIN EVERY 14 DAYS   ATOPIC DERMATITIS Exam: pink excoriated papules at L foot dorsum, pink excoriated papule at right mid back <1% BSA  Chronic condition with duration or expected duration over one year. Currently well-controlled on Dupixent .  Atopic dermatitis - Severe, on Dupixent  (biologic medication).  Atopic dermatitis (eczema) is a chronic, relapsing, pruritic condition that can significantly affect quality of life. It is often associated with allergic rhinitis and/or asthma and can require treatment with topical medications, phototherapy, or in severe cases biologic medications, which require long term medication management.    Treatment Plan: Started Dupixent  06/06/2022. Continue Dupixent  300 mg/2mL SQ QOW. Patient denies side effects. Continue Clobetasol  cr once to twice daily as needed for flares. Avoid applying to face, groin,  and axilla. Use as directed. Long-term use can cause thinning of the skin.    Dupilumab  (Dupixent ) is a treatment given by injection for adults and children with moderate-to-severe atopic dermatitis. Goal is control of skin condition, not cure. It is given as 2 injections at the first dose followed by 1 injection ever 2 weeks thereafter.  Young children are dosed monthly.  Potential side effects include allergic reaction, herpes infections, injection site reactions and conjunctivitis (inflammation of the eyes).  The use of Dupixent  requires long term medication management, including periodic office visits.    Long term medication management.  Patient is using long term (months to years) prescription medication  to control their dermatologic condition.  These medications require periodic monitoring to evaluate for efficacy and side effects and may require periodic laboratory monitoring.   Recommend gentle skin care.  ANDROGENETIC ALOPECIA (FEMALE PATTERN HAIR LOSS) Exam: mild thinning of the crown and narrowing of the midline part when compared to baseline photos 02/24/2022.   Chronic and persistent condition with duration or expected duration over one year. Condition is improving with treatment but not currently at goal.   Female Androgenic Alopecia is a chronic condition related to genetics and/or hormonal changes.  In women androgenetic alopecia is commonly associated with menopause but may occur any time after puberty.  It causes hair thinning primarily on the crown with widening of the part and temporal hairline recession.  Can use OTC Rogaine  (minoxidil ) 5% solution/foam as directed.  Oral treatments in female patients who have no contraindication may include : - Low dose oral minoxidil  1.25 - 5mg  daily - Spironolactone 50 - 100mg  bid - Finasteride  2.5 - 5 mg daily Adjunctive therapies include: -  Low Level Laser Light Therapy (LLLT) - Platelet-rich plasma injections (PRP) - Hair  Transplants or scalp reduction   Treatment Plan: BP: 128/69  Cont Minoxidil  2.5mg  1 po qd dsp #90 1Rf Cont Finasteride  5mg  1 po every day dsp #90 1Rf  Doses of oral minoxidil  for hair loss are considered 'low dose'. This is because the doses used for hair loss are much lower than the doses which are used for conditions such as high blood pressure (hypertension). The doses used for hypertension are 10-40mg  per day.  Side effects are uncommon at the low doses (up to 2.5 mg/day) used to treat hair loss. Potential side effects, more commonly seen at higher doses, include: Increase in hair growth (hypertrichosis) elsewhere on face and body Temporary hair shedding upon starting medication which may last up to 4 weeks Ankle swelling, fluid retention, rapid weight gain more than 5 pounds Low blood pressure and feeling lightheaded or dizzy when standing up quickly Fast or irregular heartbeat Headaches  Counseled that finasteride  can decrease libido (sexual drive). Advised it should not be taken by pregnant women or women who could become pregnant. Advised not to donate blood products while taking this medication. Advised if medication is stopped, they may lose the hair the medication has been helping to grow.   Long term medication management.  Patient is using long term (months to years) prescription medication  to control their dermatologic condition.  These medications require periodic monitoring to evaluate for efficacy and side effects and may require periodic laboratory monitoring.    Return in about 6 months (around 07/08/2024) for Atopic Dermatitis Follow Up, Alopecia Follow Up.  I, Jill Parcell, CMA, am acting as scribe for Rexene Rattler, MD.    Documentation: I have reviewed the above documentation for accuracy and completeness, and I agree with the above.  Rexene Rattler, MD

## 2024-01-09 ENCOUNTER — Other Ambulatory Visit (HOSPITAL_COMMUNITY): Payer: Self-pay

## 2024-01-22 ENCOUNTER — Other Ambulatory Visit: Payer: Self-pay | Admitting: Internal Medicine

## 2024-01-22 DIAGNOSIS — M5416 Radiculopathy, lumbar region: Secondary | ICD-10-CM

## 2024-01-24 ENCOUNTER — Ambulatory Visit
Admission: RE | Admit: 2024-01-24 | Discharge: 2024-01-24 | Disposition: A | Discharge status: Home / Self Care | Source: Ambulatory Visit | Attending: Internal Medicine | Admitting: Internal Medicine

## 2024-01-24 ENCOUNTER — Other Ambulatory Visit (HOSPITAL_COMMUNITY): Payer: Self-pay

## 2024-01-24 DIAGNOSIS — M5416 Radiculopathy, lumbar region: Secondary | ICD-10-CM | POA: Diagnosis present

## 2024-01-28 ENCOUNTER — Encounter: Payer: Self-pay | Admitting: *Deleted

## 2024-01-31 NOTE — Telephone Encounter (Signed)
 Noted. I notice patient has transferred are to [facility] clinic.   -Carrol Aurora, DNP, AGNP-C 01/31/2024 9:25 AM

## 2024-02-04 ENCOUNTER — Ambulatory Visit: Admitting: Family Medicine

## 2024-02-06 ENCOUNTER — Other Ambulatory Visit (HOSPITAL_COMMUNITY): Payer: Self-pay

## 2024-02-06 NOTE — Telephone Encounter (Signed)
 Per test claim through [facility] this medication is now covered by insurance:

## 2024-02-18 ENCOUNTER — Other Ambulatory Visit: Payer: Self-pay | Admitting: General Practice

## 2024-02-18 DIAGNOSIS — I10 Essential (primary) hypertension: Secondary | ICD-10-CM

## 2024-02-27 ENCOUNTER — Ambulatory Visit: Admitting: General Practice

## 2024-07-08 ENCOUNTER — Ambulatory Visit: Payer: Self-pay | Admitting: Dermatology
